# Patient Record
Sex: Male | Born: 1950 | Race: White | Hispanic: No | Marital: Married | Smoking: Former smoker
Health system: Southern US, Community
[De-identification: ages and names within clinical notes are randomized; demographics above are authoritative.]

## PROBLEM LIST (undated history)

## (undated) DIAGNOSIS — I714 Abdominal aortic aneurysm, without rupture, unspecified: Secondary | ICD-10-CM

## (undated) DIAGNOSIS — E7800 Pure hypercholesterolemia, unspecified: Secondary | ICD-10-CM

## (undated) DIAGNOSIS — N189 Chronic kidney disease, unspecified: Secondary | ICD-10-CM

## (undated) HISTORY — DX: Pure hypercholesterolemia, unspecified: E78.00

## (undated) HISTORY — PX: NOSE SURGERY: SHX723

## (undated) HISTORY — DX: Chronic kidney disease, unspecified: N18.9

## (undated) HISTORY — DX: Abdominal aortic aneurysm, without rupture, unspecified: I71.40

---

## 2003-01-04 ENCOUNTER — Ambulatory Visit (HOSPITAL_COMMUNITY): Admission: RE | Admit: 2003-01-04 | Discharge: 2003-01-04 | Payer: Self-pay | Admitting: Gastroenterology

## 2016-05-27 ENCOUNTER — Other Ambulatory Visit: Payer: Self-pay | Admitting: Gastroenterology

## 2017-11-07 DIAGNOSIS — R42 Dizziness and giddiness: Secondary | ICD-10-CM | POA: Diagnosis not present

## 2017-11-07 DIAGNOSIS — R410 Disorientation, unspecified: Secondary | ICD-10-CM | POA: Diagnosis not present

## 2017-11-08 ENCOUNTER — Other Ambulatory Visit: Payer: Self-pay | Admitting: Family Medicine

## 2017-11-08 DIAGNOSIS — R0989 Other specified symptoms and signs involving the circulatory and respiratory systems: Secondary | ICD-10-CM

## 2017-11-09 ENCOUNTER — Encounter: Payer: Self-pay | Admitting: Neurology

## 2017-11-09 ENCOUNTER — Ambulatory Visit
Admission: RE | Admit: 2017-11-09 | Discharge: 2017-11-09 | Disposition: A | Discharge status: Home / Self Care | Payer: Medicare HMO | Source: Ambulatory Visit | Attending: Family Medicine | Admitting: Family Medicine

## 2017-11-09 ENCOUNTER — Encounter (INDEPENDENT_AMBULATORY_CARE_PROVIDER_SITE_OTHER): Payer: Self-pay

## 2017-11-09 ENCOUNTER — Ambulatory Visit: Payer: Medicare HMO | Admitting: Neurology

## 2017-11-09 VITALS — BP 138/82 | HR 78 | Ht 68.5 in | Wt 143.5 lb

## 2017-11-09 DIAGNOSIS — H81399 Other peripheral vertigo, unspecified ear: Secondary | ICD-10-CM | POA: Diagnosis not present

## 2017-11-09 DIAGNOSIS — R002 Palpitations: Secondary | ICD-10-CM | POA: Insufficient documentation

## 2017-11-09 DIAGNOSIS — I6523 Occlusion and stenosis of bilateral carotid arteries: Secondary | ICD-10-CM | POA: Diagnosis not present

## 2017-11-09 DIAGNOSIS — R42 Dizziness and giddiness: Secondary | ICD-10-CM | POA: Diagnosis not present

## 2017-11-09 DIAGNOSIS — R0989 Other specified symptoms and signs involving the circulatory and respiratory systems: Secondary | ICD-10-CM

## 2017-11-09 NOTE — Progress Notes (Signed)
PATIENT: Ian [name] DOB: 1951/11/11  Chief Complaint  Patient presents with  . Possible TIA    He is here with wife, Ian [name]. Reports episode of dizziness, confusion, staggering gait, and fatigue on 11/05/17. He declined to go the ED for evaluation. He returned to [facility] the next day.  He followed up with his PCP who felt this event may have been a TIA and started him on aspirin 81mg  daily.  No further episodes have occurred.  Marland Kitchen PCP    Leighton Ruff, MD     HISTORICAL  Ian [name] accompanied by his wife Ian [name], seen in refer by primary care doctor Ian [name] for evaluation of acute onset dizziness, initial evaluation was on November 09, 2017.  He was previously healthy, has a habit of drinking 4 or 5 cups of coffee on the weekend, November 05, 2017, he drank 5 cups of coffee, felt heart palpitations, mild dizziness, he got up walked about 50 yards to his mailbox, on his way back, he felt increased lightheadedness, generalized weakness, heart palpitation, fell to the ground, no loss of consciousness, but difficulty to get up, he stayed on the ground for at least 10 minutes, his symptoms had mild improvement, but he was able to walk back to his home, but felt during the rest of the morning, he denies vertigo, no loss of consciousness, no lateralized motor or sensory deficit, He was seen by his primary care physician, reported normal CMP CBC TSH, was started on aspirin 81 mg daily, ultrasound of carotid artery was ordered  REVIEW OF SYSTEMS: Full 14 system review of systems performed and notable only for weight loss, fatigue, dizziness, restless leg, anxiety, decreased energy, urination problem  ALLERGIES: No Known Allergies  HOME MEDICATIONS: Current Outpatient Medications  Medication Sig Dispense Refill  . aspirin 81 MG tablet Take 81 mg by mouth daily.    . Multiple Vitamin (MULTIVITAMIN) tablet Take 1 tablet by mouth daily.     No current facility-administered  medications for this visit.     PAST MEDICAL HISTORY: Past Medical History:  Diagnosis Date  . Hypercholesteremia     PAST SURGICAL HISTORY: Past Surgical History:  Procedure Laterality Date  . NOSE SURGERY      FAMILY HISTORY: Family History  Problem Relation Age of Onset  . Colon cancer Mother   . Bladder Cancer Father     SOCIAL HISTORY:  Social History   Socioeconomic History  . Marital status: Married    Spouse name: Not on file  . Number of children: 0  . Years of education: 16  . Highest education level: Bachelor's degree (e.g., BA, AB, BS)  Social Needs  . Financial resource strain: Not on file  . Food insecurity - worry: Not on file  . Food insecurity - inability: Not on file  . Transportation needs - medical: Not on file  . Transportation needs - non-medical: Not on file  Occupational History  . Occupation: Quality control  Tobacco Use  . Smoking status: Former Research scientist (life sciences)  . Smokeless tobacco: Never Used  Substance and Sexual Activity  . Alcohol use: No    Frequency: Never  . Drug use: No  . Sexual activity: Not on file  Other Topics Concern  . Not on file  Social History Narrative   Lives at home with his wife.   Right-handed.   No daily use of caffeine.     PHYSICAL EXAM   Vitals:   11/09/17 0728  BP:  138/82  Pulse: 78  Weight: 143 lb 8 oz (65.1 kg)  Height: 5' 8.5" (1.74 m)    Not recorded      Body mass index is 21.5 kg/m.  PHYSICAL EXAMNIATION:  Gen: NAD, conversant, well nourised, obese, well groomed                     Cardiovascular: Regular rate rhythm, no peripheral edema, warm, nontender. Eyes: Conjunctivae clear without exudates or hemorrhage Neck: Supple, no carotid bruits. Pulmonary: Clear to auscultation bilaterally   NEUROLOGICAL EXAM:  MENTAL STATUS: Speech:    Speech is normal; fluent and spontaneous with normal comprehension.  Cognition:     Orientation to time, place and person     Normal recent and  remote memory     Normal Attention span and concentration     Normal Language, naming, repeating,spontaneous speech     Fund of knowledge   CRANIAL NERVES: CN II: Visual fields are full to confrontation. Fundoscopic exam is normal with sharp discs and no vascular changes. Pupils are round equal and briskly reactive to light. CN III, IV, VI: extraocular movement are normal. No ptosis. CN V: Facial sensation is intact to pinprick in all 3 divisions bilaterally. Corneal responses are intact.  CN VII: Face is symmetric with normal eye closure and smile. CN VIII: Hearing is normal to rubbing fingers CN IX, X: Palate elevates symmetrically. Phonation is normal. CN XI: Head turning and shoulder shrug are intact CN XII: Tongue is midline with normal movements and no atrophy.  MOTOR: There is no pronator drift of out-stretched arms. Muscle bulk and tone are normal. Muscle strength is normal.  REFLEXES: Reflexes are 2+ and symmetric at the biceps, triceps, knees, and ankles. Plantar responses are flexor.  SENSORY: Intact to light touch, pinprick, positional sensation and vibratory sensation are intact in fingers and toes.  COORDINATION: Rapid alternating movements and fine finger movements are intact. There is no dysmetria on finger-to-nose and heel-knee-shin.    GAIT/STANCE: Posture is normal. Gait is steady with normal steps, base, arm swing, and turning. Heel and toe walking are normal. Tandem gait is normal.  Romberg is absent.   DIAGNOSTIC DATA (LABS, IMAGING, TESTING) - I reviewed patient records, labs, notes, testing and imaging myself where available.   ASSESSMENT AND PLAN  DEJEAN TRIBBY is a 66 y.o. male   Acute onset of dizziness, palpitation, excessive caffeine intake  Differentiation diagnosis including cardiac arrhythmia, remote possibility of TIA  Continue aspirin 81 mg daily  Holter monitoring, echocardiogram  MRI of the brain   Marcial Pacas, M.D.  Ph.D.  [facility] [address] [place], [place] [address] Ph: [phone] Fax: [phone]  MP:NTIR, Edwyna Shell, MD

## 2017-11-10 ENCOUNTER — Other Ambulatory Visit: Payer: Self-pay

## 2017-11-10 ENCOUNTER — Telehealth: Payer: Self-pay | Admitting: *Deleted

## 2017-11-10 ENCOUNTER — Ambulatory Visit (HOSPITAL_COMMUNITY): Payer: Medicare HMO | Attending: Cardiovascular Disease

## 2017-11-10 DIAGNOSIS — R42 Dizziness and giddiness: Secondary | ICD-10-CM

## 2017-11-10 DIAGNOSIS — R002 Palpitations: Secondary | ICD-10-CM | POA: Diagnosis not present

## 2017-11-10 DIAGNOSIS — H81399 Other peripheral vertigo, unspecified ear: Secondary | ICD-10-CM

## 2017-11-10 DIAGNOSIS — I420 Dilated cardiomyopathy: Secondary | ICD-10-CM | POA: Diagnosis not present

## 2017-11-10 LAB — ECHOCARDIOGRAM COMPLETE
Ao-asc: 37 cm
CHL CUP MV DEC (S): 246
E/e' ratio: 7.22
EWDT: 246 ms
FS: 31 % (ref 28–44)
IV/PV OW: 1.07
LA ID, A-P, ES: 38 mm
LA diam index: 2.13 cm/m2
LA vol A4C: 31.3 ml
LA vol index: 23.8 mL/m2
LA vol: 42.3 mL
LEFT ATRIUM END SYS DIAM: 38 mm
LV E/e'average: 7.22
LV PW d: 8.99 mm — AB (ref 0.6–1.1)
LV TDI E'MEDIAL: 7.4
LVEEMED: 7.22
LVELAT: 7.94 cm/s
LVOT VTI: 19.7 cm
LVOT area: 3.46 cm2
LVOTD: 21 mm
LVOTPV: 80.7 cm/s
LVOTSV: 68 mL
MV pk E vel: 57.3 m/s
MVPKAVEL: 80.4 m/s
RV LATERAL S' VELOCITY: 9.9 cm/s
RV TAPSE: 17.8 mm
TDI e' lateral: 7.94

## 2017-11-10 NOTE — Telephone Encounter (Signed)
-----   Message from Marcial Pacas, MD sent at 11/10/2017  1:56 PM EST ----- Please call patient showed mild inferior wall hypokinesia, which can be seen in patient with previous cardiac muscle damage such as CAD, normal EF 50-55%, no valvular disease.

## 2017-11-10 NOTE — Telephone Encounter (Signed)
Spoke to patient's wife on HIPAA - she is aware of results.  Results will also be faxed to his PCP, Dr. Jacelyn Grip.

## 2017-11-18 ENCOUNTER — Ambulatory Visit
Admission: RE | Admit: 2017-11-18 | Discharge: 2017-11-18 | Disposition: A | Discharge status: Home / Self Care | Payer: Medicare HMO | Source: Ambulatory Visit | Attending: Neurology | Admitting: Neurology

## 2017-11-18 DIAGNOSIS — H81399 Other peripheral vertigo, unspecified ear: Secondary | ICD-10-CM

## 2017-11-19 DIAGNOSIS — H81399 Other peripheral vertigo, unspecified ear: Secondary | ICD-10-CM | POA: Diagnosis not present

## 2017-11-21 ENCOUNTER — Telehealth: Payer: Self-pay | Admitting: Neurology

## 2017-11-21 NOTE — Telephone Encounter (Addendum)
Spoke to patient's wife on HIPAA - she is aware of MRI results.  Says they have not received a phone call about his holter monitor yet.

## 2017-11-21 NOTE — Telephone Encounter (Signed)
MRI of brain showed atrophy, mild supratentorium small vessel disease.  We will review MRI at next visit in Feb 2019.   IMPRESSION:  This MRI of the brain without contrast shows the following: 1.   Scattered T2/FLAIR hyperintense foci in the hemispheres consistent with mild chronic microvascular ischemic change. 2.   There are no acute findings.

## 2017-11-22 NOTE — Telephone Encounter (Signed)
Called and left [place] a message to call and schedule an apt [phone] . Will call and give patient the number also.

## 2017-12-13 ENCOUNTER — Ambulatory Visit (INDEPENDENT_AMBULATORY_CARE_PROVIDER_SITE_OTHER): Payer: Medicare HMO

## 2017-12-13 ENCOUNTER — Encounter (INDEPENDENT_AMBULATORY_CARE_PROVIDER_SITE_OTHER): Payer: Self-pay

## 2017-12-13 DIAGNOSIS — R002 Palpitations: Secondary | ICD-10-CM

## 2017-12-13 DIAGNOSIS — H81399 Other peripheral vertigo, unspecified ear: Secondary | ICD-10-CM | POA: Diagnosis not present

## 2017-12-13 DIAGNOSIS — R42 Dizziness and giddiness: Secondary | ICD-10-CM

## 2017-12-20 ENCOUNTER — Telehealth: Payer: Self-pay | Admitting: *Deleted

## 2017-12-20 NOTE — Telephone Encounter (Signed)
-----   Message from Marcial Pacas, MD sent at 12/20/2017  9:12 AM EST ----- Please call patient: No significant abnormality found in 48 hours Holter monitoring

## 2017-12-20 NOTE — Telephone Encounter (Signed)
Left patient a detailed message, with results, on voicemail (ok per DPR).  Provided our number to call back with any questions.  

## 2018-01-26 ENCOUNTER — Ambulatory Visit: Payer: Medicare HMO | Admitting: Neurology

## 2018-05-04 ENCOUNTER — Encounter: Payer: Self-pay | Admitting: Neurology

## 2018-05-04 ENCOUNTER — Ambulatory Visit: Payer: Medicare HMO | Admitting: Neurology

## 2018-05-04 VITALS — BP 128/64 | HR 72 | Ht 68.5 in | Wt 146.0 lb

## 2018-05-04 DIAGNOSIS — R002 Palpitations: Secondary | ICD-10-CM | POA: Diagnosis not present

## 2018-05-04 DIAGNOSIS — R42 Dizziness and giddiness: Secondary | ICD-10-CM

## 2018-05-04 DIAGNOSIS — I779 Disorder of arteries and arterioles, unspecified: Secondary | ICD-10-CM | POA: Diagnosis not present

## 2018-05-04 DIAGNOSIS — I739 Peripheral vascular disease, unspecified: Secondary | ICD-10-CM

## 2018-05-04 NOTE — Progress Notes (Signed)
PATIENT: Ian [name] DOB: [birth year]  Chief Complaint  Patient presents with  . Dizziness    He is here with his wife, Stanton Kidney, to review results (MRI, ECHO, Holter monitoring).  Says Dr. Jacelyn Grip stopped his daily aspirin 81mg  and started him on atorvastatin 20mg  daily.     HISTORICAL  Ian [name] accompanied by his wife Stanton Kidney, seen in refer by primary care doctor Yaakov Guthrie for evaluation of acute onset dizziness, initial evaluation was on November 09, 2017.  He was previously healthy, has a habit of drinking 4 or 5 cups of coffee on the weekend, November 05, 2017, he drank 5 cups of coffee, felt heart palpitations, mild dizziness, he got up walked about 50 yards to his mailbox, on his way back, he felt increased lightheadedness, generalized weakness, heart palpitation, fell to the ground, no loss of consciousness, but difficulty to get up, he stayed on the ground for at least 10 minutes, his symptoms had mild improvement, but he was able to walk back to his home, but felt during the rest of the morning, he denies vertigo, no loss of consciousness, no lateralized motor or sensory deficit, He was seen by his primary care physician, reported normal CMP CBC TSH, was started on aspirin 81 mg daily, ultrasound of carotid artery was ordered  UPDATE May 04 2018: He is doing very well, has no recurrent spells of dizziness,  We reviewed MRI of the brain in December 2018, mild generalized atrophy, supratentorium small vessel disease Ultrasound of carotid arteries showed right internal carotid artery moderate mixed plaque in the right bulb, 50 to 69% stenosis, left side is less than 50% stenosis, mild calcified plaque, bilateral vertebral artery was antegrade  48 hours Holter monitoring was normal  His aspirin 81 mg was stopped by his primary care physician after his wife reported that he has mild GI symptoms,  REVIEW OF SYSTEMS: Full 14 system review of systems performed and notable  only for unexpected weight change, frequent urination, bloody urine, bruise easily  ALLERGIES: No Known Allergies  HOME MEDICATIONS: Current Outpatient Medications  Medication Sig Dispense Refill  . atorvastatin (LIPITOR) 20 MG tablet Take 20 mg by mouth daily.  5  . UNABLE TO FIND Take 2 tablets by mouth daily. Super Beta Prostate     No current facility-administered medications for this visit.     PAST MEDICAL HISTORY: Past Medical History:  Diagnosis Date  . Hypercholesteremia     PAST SURGICAL HISTORY: Past Surgical History:  Procedure Laterality Date  . NOSE SURGERY      FAMILY HISTORY: Family History  Problem Relation Age of Onset  . Colon cancer Mother   . Bladder Cancer Father     SOCIAL HISTORY:  Social History   Socioeconomic History  . Marital status: Married    Spouse name: Not on file  . Number of children: 0  . Years of education: 16  . Highest education level: Bachelor's degree (e.g., BA, AB, BS)  Occupational History  . Occupation: Quality control  Social Needs  . Financial resource strain: Not on file  . Food insecurity:    Worry: Not on file    Inability: Not on file  . Transportation needs:    Medical: Not on file    Non-medical: Not on file  Tobacco Use  . Smoking status: Former Research scientist (life sciences)  . Smokeless tobacco: Never Used  Substance and Sexual Activity  . Alcohol use: No    Frequency: Never  .  Drug use: No  . Sexual activity: Not on file  Lifestyle  . Physical activity:    Days per week: Not on file    Minutes per session: Not on file  . Stress: Not on file  Relationships  . Social connections:    Talks on phone: Not on file    Gets together: Not on file    Attends religious service: Not on file    Active member of club or organization: Not on file    Attends meetings of clubs or organizations: Not on file    Relationship status: Not on file  . Intimate partner violence:    Fear of current or ex partner: Not on file     Emotionally abused: Not on file    Physically abused: Not on file    Forced sexual activity: Not on file  Other Topics Concern  . Not on file  Social History Narrative   Lives at home with his wife.   Right-handed.   No daily use of caffeine.     PHYSICAL EXAM   Vitals:   05/04/18 1601  BP: 128/64  Pulse: 72  Weight: 146 lb (66.2 kg)  Height: 5' 8.5" (1.74 m)    Not recorded      Body mass index is 21.88 kg/m.  PHYSICAL EXAMNIATION:  Gen: NAD, conversant, well nourised, obese, well groomed                     Cardiovascular: Regular rate rhythm, no peripheral edema, warm, nontender. Eyes: Conjunctivae clear without exudates or hemorrhage Neck: Supple, no carotid bruits. Pulmonary: Clear to auscultation bilaterally   NEUROLOGICAL EXAM:  MENTAL STATUS: Speech:    Speech is normal; fluent and spontaneous with normal comprehension.  Cognition:     Orientation to time, place and person     Normal recent and remote memory     Normal Attention span and concentration     Normal Language, naming, repeating,spontaneous speech     Fund of knowledge   CRANIAL NERVES: CN II: Visual fields are full to confrontation. Fundoscopic exam is normal with sharp discs and no vascular changes. Pupils are round equal and briskly reactive to light. CN III, IV, VI: extraocular movement are normal. No ptosis. CN V: Facial sensation is intact to pinprick in all 3 divisions bilaterally. Corneal responses are intact.  CN VII: Face is symmetric with normal eye closure and smile. CN VIII: Hearing is normal to rubbing fingers CN IX, X: Palate elevates symmetrically. Phonation is normal. CN XI: Head turning and shoulder shrug are intact CN XII: Tongue is midline with normal movements and no atrophy.  MOTOR: There is no pronator drift of out-stretched arms. Muscle bulk and tone are normal. Muscle strength is normal.  REFLEXES: Reflexes are 2+ and symmetric at the biceps, triceps, knees, and  ankles. Plantar responses are flexor.  SENSORY: Intact to light touch, pinprick, positional sensation and vibratory sensation are intact in fingers and toes.  COORDINATION: Rapid alternating movements and fine finger movements are intact. There is no dysmetria on finger-to-nose and heel-knee-shin.    GAIT/STANCE: Posture is normal. Gait is steady with normal steps, base, arm swing, and turning. Heel and toe walking are normal. Tandem gait is normal.  Romberg is absent.   DIAGNOSTIC DATA (LABS, IMAGING, TESTING) - I reviewed patient records, labs, notes, testing and imaging myself where available.   ASSESSMENT AND PLAN  Ian [name] is a 67 y.o. male  Acute onset of dizziness, palpitation, excessive caffeine intake  Differentiation diagnosis including cardiac arrhythmia due to excessive caffeine intake  He has no recurrent spells,,  Bilateral internal carotid artery disease  He would benefit aspirin 81 mg daily  Continue to address vascular risk factor  Marcial Pacas, M.D. Ph.D.  [facility] [address], [place], [place] [address] Ph: [phone] Fax: [phone]  N[place]:UDOD, Edwyna Shell, MD

## 2018-06-01 DIAGNOSIS — K2900 Acute gastritis without bleeding: Secondary | ICD-10-CM | POA: Diagnosis not present

## 2018-06-01 DIAGNOSIS — R634 Abnormal weight loss: Secondary | ICD-10-CM | POA: Diagnosis not present

## 2018-06-01 DIAGNOSIS — R7301 Impaired fasting glucose: Secondary | ICD-10-CM | POA: Diagnosis not present

## 2018-06-01 DIAGNOSIS — Z0000 Encounter for general adult medical examination without abnormal findings: Secondary | ICD-10-CM | POA: Diagnosis not present

## 2018-06-01 DIAGNOSIS — K529 Noninfective gastroenteritis and colitis, unspecified: Secondary | ICD-10-CM | POA: Diagnosis not present

## 2018-06-01 DIAGNOSIS — E7800 Pure hypercholesterolemia, unspecified: Secondary | ICD-10-CM | POA: Diagnosis not present

## 2018-06-01 DIAGNOSIS — Z125 Encounter for screening for malignant neoplasm of prostate: Secondary | ICD-10-CM | POA: Diagnosis not present

## 2018-06-01 DIAGNOSIS — R410 Disorientation, unspecified: Secondary | ICD-10-CM | POA: Diagnosis not present

## 2018-06-01 DIAGNOSIS — I779 Disorder of arteries and arterioles, unspecified: Secondary | ICD-10-CM | POA: Diagnosis not present

## 2018-06-01 DIAGNOSIS — R931 Abnormal findings on diagnostic imaging of heart and coronary circulation: Secondary | ICD-10-CM | POA: Diagnosis not present

## 2018-06-02 DIAGNOSIS — K529 Noninfective gastroenteritis and colitis, unspecified: Secondary | ICD-10-CM | POA: Diagnosis not present

## 2018-06-15 DIAGNOSIS — R634 Abnormal weight loss: Secondary | ICD-10-CM | POA: Diagnosis not present

## 2018-06-15 DIAGNOSIS — R7989 Other specified abnormal findings of blood chemistry: Secondary | ICD-10-CM | POA: Diagnosis not present

## 2018-06-15 DIAGNOSIS — R748 Abnormal levels of other serum enzymes: Secondary | ICD-10-CM | POA: Diagnosis not present

## 2018-07-06 DIAGNOSIS — Z0189 Encounter for other specified special examinations: Secondary | ICD-10-CM | POA: Diagnosis not present

## 2018-07-06 DIAGNOSIS — I6521 Occlusion and stenosis of right carotid artery: Secondary | ICD-10-CM | POA: Diagnosis not present

## 2018-08-02 DIAGNOSIS — I6521 Occlusion and stenosis of right carotid artery: Secondary | ICD-10-CM | POA: Diagnosis not present

## 2018-09-25 DIAGNOSIS — R7301 Impaired fasting glucose: Secondary | ICD-10-CM | POA: Diagnosis not present

## 2018-09-25 DIAGNOSIS — R899 Unspecified abnormal finding in specimens from other organs, systems and tissues: Secondary | ICD-10-CM | POA: Diagnosis not present

## 2018-09-25 DIAGNOSIS — E7800 Pure hypercholesterolemia, unspecified: Secondary | ICD-10-CM | POA: Diagnosis not present

## 2018-11-13 DIAGNOSIS — R7301 Impaired fasting glucose: Secondary | ICD-10-CM | POA: Diagnosis not present

## 2018-11-13 DIAGNOSIS — R899 Unspecified abnormal finding in specimens from other organs, systems and tissues: Secondary | ICD-10-CM | POA: Diagnosis not present

## 2018-11-13 DIAGNOSIS — E7800 Pure hypercholesterolemia, unspecified: Secondary | ICD-10-CM | POA: Diagnosis not present

## 2018-11-20 DIAGNOSIS — R899 Unspecified abnormal finding in specimens from other organs, systems and tissues: Secondary | ICD-10-CM | POA: Diagnosis not present

## 2018-11-20 DIAGNOSIS — R7301 Impaired fasting glucose: Secondary | ICD-10-CM | POA: Diagnosis not present

## 2018-11-20 DIAGNOSIS — E7800 Pure hypercholesterolemia, unspecified: Secondary | ICD-10-CM | POA: Diagnosis not present

## 2018-12-01 ENCOUNTER — Encounter (HOSPITAL_BASED_OUTPATIENT_CLINIC_OR_DEPARTMENT_OTHER): Payer: Self-pay | Admitting: *Deleted

## 2018-12-01 ENCOUNTER — Other Ambulatory Visit: Payer: Self-pay

## 2018-12-01 ENCOUNTER — Emergency Department (HOSPITAL_BASED_OUTPATIENT_CLINIC_OR_DEPARTMENT_OTHER)
Admission: EM | Admit: 2018-12-01 | Discharge: 2018-12-01 | Disposition: A | Discharge status: Home / Self Care | Payer: Medicare HMO | Attending: Emergency Medicine | Admitting: Emergency Medicine

## 2018-12-01 DIAGNOSIS — Y92810 Car as the place of occurrence of the external cause: Secondary | ICD-10-CM | POA: Diagnosis not present

## 2018-12-01 DIAGNOSIS — Y999 Unspecified external cause status: Secondary | ICD-10-CM | POA: Diagnosis not present

## 2018-12-01 DIAGNOSIS — Z79899 Other long term (current) drug therapy: Secondary | ICD-10-CM | POA: Diagnosis not present

## 2018-12-01 DIAGNOSIS — W228XXA Striking against or struck by other objects, initial encounter: Secondary | ICD-10-CM | POA: Diagnosis not present

## 2018-12-01 DIAGNOSIS — Z23 Encounter for immunization: Secondary | ICD-10-CM | POA: Insufficient documentation

## 2018-12-01 DIAGNOSIS — S0181XA Laceration without foreign body of other part of head, initial encounter: Secondary | ICD-10-CM | POA: Diagnosis present

## 2018-12-01 DIAGNOSIS — I2510 Atherosclerotic heart disease of native coronary artery without angina pectoris: Secondary | ICD-10-CM | POA: Insufficient documentation

## 2018-12-01 DIAGNOSIS — Y9389 Activity, other specified: Secondary | ICD-10-CM | POA: Diagnosis not present

## 2018-12-01 DIAGNOSIS — Z87891 Personal history of nicotine dependence: Secondary | ICD-10-CM | POA: Insufficient documentation

## 2018-12-01 DIAGNOSIS — S01111A Laceration without foreign body of right eyelid and periocular area, initial encounter: Secondary | ICD-10-CM | POA: Diagnosis not present

## 2018-12-01 MED ORDER — LIDOCAINE-EPINEPHRINE 2 %-1:100000 IJ SOLN
10.0000 mL | Freq: Once | INTRAMUSCULAR | Status: DC
  Filled 2018-12-01: qty 10.2

## 2018-12-01 MED ORDER — LIDOCAINE-EPINEPHRINE 1 %-1:100000 IJ SOLN
INTRAMUSCULAR | Status: AC
  Administered 2018-12-01: 1 mL
  Filled 2018-12-01: qty 1

## 2018-12-01 MED ORDER — TETANUS-DIPHTH-ACELL PERTUSSIS 5-2.5-18.5 LF-MCG/0.5 IM SUSP: 0.5000 mL | Freq: Once | INTRAMUSCULAR | Status: DC

## 2018-12-01 NOTE — ED Triage Notes (Signed)
He hit his forehead on his car door. Laceration above his right eye. Dressing intact on arrival to the ED. No LOC.

## 2018-12-01 NOTE — Discharge Instructions (Signed)
1. Medications: Alternate 600 mg of ibuprofen and [phone] mg of Tylenol every 3 hours as needed for pain. Do not exceed 4000 mg of Tylenol daily.  Take ibuprofen with food to avoid upset stomach issues. 2. Treatment: ice for swelling, keep wound clean with warm soap and water and keep bandage dry, do not submerge in water for 24 hours 3. Follow Up: Please return in 5 days to have your stitches/staples removed or sooner if you have concerns.  You may also go to urgent care or your primary care physician.  Return to the emergency department sooner if any concerning signs or symptoms develop such as high fevers, redness, drainage of pus from the wound, or swelling.  Also return if there is any evidence of serious head injury including severe headache, vision changes, vomiting, or altered mental status.   WOUND CARE  Keep area clean and dry for 24 hours. Do not remove bandage, if applied.  After 24 hours, remove bandage and wash wound gently with mild soap and warm water. Reapply a new bandage after cleaning wound, if directed.   Continue daily cleansing with soap and water until stitches/staples are removed.  Do not apply any ointments or creams to the wound while stitches/staples are in place, as this may cause delayed healing. Return if you experience any of the following signs of infection: Swelling, redness, pus drainage, streaking, fever >101.0 F  Return if you experience excessive bleeding that does not stop after 15-20 minutes of constant, firm pressure.

## 2018-12-01 NOTE — ED Provider Notes (Signed)
[facility] EMERGENCY DEPARTMENT Provider Note   CSN: [id number] Arrival date & time: 12/01/18  1617     History   Chief Complaint Chief Complaint  Patient presents with  . Laceration    HPI Ian [name] is a 67 y.o. male with history of hypercholesterolemia presents for evaluation of acute onset, persistent wound to the right eyebrow sustained just prior to arrival.  He reports that he was attempting to close his car door when the "sharp tip "of the door grazed against his right eyebrow.  He denies loss of consciousness, headache, vision changes, nausea, vomiting, numbness, tingling, or weakness.  No prodrome leading up to the injury.  He is not currently anticoagulated.  His tetanus was updated just prior to my assessment.  The history is provided by the patient.    Past Medical History:  Diagnosis Date  . Hypercholesteremia     Patient Active Problem List   Diagnosis Date Noted  . Bilateral carotid artery disease ([place]) 05/04/2018  . Dizzinesses 11/09/2017  . Palpitation 11/09/2017    Past Surgical History:  Procedure Laterality Date  . NOSE SURGERY          Home Medications    Prior to Admission medications   Medication Sig Start Date End Date Taking? Authorizing Provider  atorvastatin (LIPITOR) 20 MG tablet Take 20 mg by mouth daily. 04/20/18   [provider]  UNABLE TO FIND Take 2 tablets by mouth daily. Super Beta Prostate    [provider]    Family History Family History  Problem Relation Age of Onset  . Colon cancer Mother   . Bladder Cancer Father     Social History Social History   Tobacco Use  . Smoking status: Former Research scientist (life sciences)  . Smokeless tobacco: Never Used  Substance Use Topics  . Alcohol use: No    Frequency: Never  . Drug use: No     Allergies   Patient has no known allergies.   Review of Systems Review of Systems  Constitutional: Negative for chills and fever.  Eyes: Negative for  photophobia and visual disturbance.  Respiratory: Negative for shortness of breath.   Cardiovascular: Negative for chest pain.  Gastrointestinal: Negative for nausea and vomiting.  Skin: Positive for wound.  Neurological: Negative for syncope, light-headedness and headaches.     Physical Exam Updated Vital Signs BP 140/90 (BP Location: Left Arm)   Pulse (!) 104   Temp 98.4 F (36.9 C) (Oral)   Resp 18   Ht 5\' 8"  (1.727 m)   Wt 65.8 kg   SpO2 98%   BMI 22.05 kg/m   Physical Exam Vitals signs and nursing note reviewed.  Constitutional:      General: He is not in acute distress.    Appearance: He is well-developed.  HENT:     Head: Normocephalic.     Comments: No Battle's signs, no raccoon's eyes, no rhinorrhea. No hemotympanum.  See below images.  Patient has a 4 cm laceration overlying the right eyebrow.  He is able to raise his eyebrows bilaterally equally. Minimal tenderness to palpation of the wound.  No underlying crepitus or deformity. Eyes:     General:        Right eye: No discharge.        Left eye: No discharge.     Extraocular Movements: Extraocular movements intact.     Conjunctiva/sclera: Conjunctivae normal.     Pupils: Pupils are equal, round, and reactive to light.  Comments: No chemosis, proptosis, or consensual photophobia.  No tenderness to palpation of the orbital rim.  No pain with EOMs  Neck:     Vascular: No JVD.     Trachea: No tracheal deviation.  Cardiovascular:     Rate and Rhythm: Normal rate.  Pulmonary:     Effort: Pulmonary effort is normal.  Abdominal:     General: There is no distension.  Skin:    Findings: No erythema.  Neurological:     General: No focal deficit present.     Mental Status: He is alert and oriented to person, place, and time.     Cranial Nerves: No cranial nerve deficit.     Sensory: No sensory deficit.     Motor: No weakness.     Coordination: Coordination normal.     Comments: 5/5 strength of BUE and BLE  major muscle groups.  Cranial nerves II through XII tested and intact.  Fluent speech with no evidence of dysarthria or aphasia.  Psychiatric:        Behavior: Behavior normal.      ED Treatments / Results  Labs (all labs ordered are listed, but only abnormal results are displayed) Labs Reviewed - No data to display  EKG None  Radiology No results found.  Procedures .Marland KitchenLaceration Repair Date/Time: 12/01/2018 5:52 PM Performed by: Renita Papa, PA-C Authorized by: Renita Papa, PA-C   Consent:    Consent obtained:  Verbal   Consent given by:  Patient   Risks discussed:  Infection, need for additional repair, pain, poor cosmetic result and poor wound healing   Alternatives discussed:  No treatment and delayed treatment Universal protocol:    Procedure explained and questions answered to patient or proxy's satisfaction: yes     Relevant documents present and verified: yes     Test results available and properly labeled: yes     Required blood products, implants, devices, and special equipment available: yes     Site/side marked: yes     Immediately prior to procedure, a time out was called: yes     Patient identity confirmed:  Verbally with patient Anesthesia (see MAR for exact dosages):    Anesthesia method:  Local infiltration   Local anesthetic:  Lidocaine 1% WITH epi Laceration details:    Location:  Face   Face location:  R eyebrow   Length (cm):  4   Depth (mm):  4 Repair type:    Repair type:  Simple Pre-procedure details:    Preparation:  Patient was prepped and draped in usual sterile fashion Exploration:    Hemostasis achieved with:  Direct pressure   Wound exploration: wound explored through full range of motion     Wound extent: areolar tissue violated     Wound extent: no muscle damage noted     Contaminated: yes   Treatment:    Area cleansed with:  Betadine and saline   Amount of cleaning:  Extensive   Irrigation solution:  Sterile saline    Irrigation method:  Pressure wash   Visualized foreign bodies/material removed: no   Skin repair:    Repair method:  Sutures   Suture size:  6-0   Suture material:  Prolene   Suture technique:  Simple interrupted   Number of sutures:  8 Approximation:    Approximation:  Close Post-procedure details:    Dressing:  Open (no dressing)   Patient tolerance of procedure:  Tolerated well, no immediate complications   (  including critical care time)    Medications Ordered in ED Medications  lidocaine-EPINEPHrine (XYLOCAINE W/EPI) 1 %-1:100000 (with pres) injection (has no administration in time range)  Tdap (BOOSTRIX) injection 0.5 mL (has no administration in time range)     Initial Impression / Assessment and Plan / ED Course  I have reviewed the triage vital signs and the nursing notes.  Pertinent labs & imaging results that were available during my care of the patient were reviewed by me and considered in my medical decision making (see chart for details).     Patient with right eyebrow laceration after contact with the corner of a car door frame.  Patient afebrile, initially mildly tachycardic at triage but was not tachycardic on my assessment.  No focal neurologic deficits.  No evidence of serious head injury.  Doubt skull fracture, SAH, ICH, ocular nerve entrapment or orbit/globe rupture. Pressure irrigation performed. Wound explored and base of wound visualized in a bloodless field without evidence of foreign body.  Laceration occurred < 8 hours prior to repair which was well tolerated.  Tdap updated.  Pt has  no comorbidities to effect normal wound healing. Pt discharged  without antibiotics.  Discussed suture home care with patient and answered questions. Pt to follow-up for wound check and suture removal in 5 days; they are to return to the ED sooner for signs of infection.  Discussed signs of serious head injury and indications to return to the ED.  Patient and wife verbalized  understanding of and agreement with plan and patient stable for discharge home at this time.  Final Clinical Impressions(s) / ED Diagnoses   Final diagnoses:  Facial laceration, initial encounter    ED Discharge Orders    None       Debroah Baller 12/01/18 1755    Virgel Manifold, MD 12/01/18 1815

## 2018-12-08 DIAGNOSIS — Z4802 Encounter for removal of sutures: Secondary | ICD-10-CM | POA: Diagnosis not present

## 2018-12-11 DIAGNOSIS — K219 Gastro-esophageal reflux disease without esophagitis: Secondary | ICD-10-CM | POA: Insufficient documentation

## 2018-12-11 DIAGNOSIS — E785 Hyperlipidemia, unspecified: Secondary | ICD-10-CM

## 2018-12-11 DIAGNOSIS — E782 Mixed hyperlipidemia: Secondary | ICD-10-CM | POA: Insufficient documentation

## 2019-01-02 DIAGNOSIS — I6523 Occlusion and stenosis of bilateral carotid arteries: Secondary | ICD-10-CM | POA: Diagnosis not present

## 2019-01-08 ENCOUNTER — Ambulatory Visit: Payer: Self-pay | Admitting: Cardiology

## 2019-01-08 NOTE — Progress Notes (Deleted)
Patient is here for follow up visit.  Subjective:   @Patient  ID: Ian [name], male    DOB: 03-Dec-1951, 68 y.o.   MRN: [id number]  No chief complaint on file.  68 year old Caucasian male with asymptomatic carotid artery stenosis.  Patient has had significant improvement in his carotid artery plaque burden, as seen on carotid US, details below.   Carotid artery duplex 01/02/2019: Minimal tenosis in the right internal carotid artery (1-15%).  Stenosis in the right common carotid artery (<50%). Stenosis in the right external carotid artery (<50%). Minimal stenosis in the left internal carotid artery (1-15%). Stenosis in the left common carotid artery (<50%). Stenosis in the left external carotid artery (<50%). Antegrade right vertebral artery flow. Antegrade left vertebral artery flow. Compared to the study done on 08/02/2018, right ICA velocity is decreased and stenosis range of greater than 50% is now less than 50% throughout.  Significant improvement in plaque burden. Follow up in one year is appropriate if clinically indicated. HPI  Past Medical History:  Diagnosis Date  . Hypercholesteremia     Past Surgical History:  Procedure Laterality Date  . NOSE SURGERY      Social History   Socioeconomic History  . Marital status: Married    Spouse name: Not on file  . Number of children: 0  . Years of education: 16  . Highest education level: Bachelor's degree (e.g., BA, AB, BS)  Occupational History  . Occupation: Quality control  Social Needs  . Financial resource strain: Not on file  . Food insecurity:    Worry: Not on file    Inability: Not on file  . Transportation needs:    Medical: Not on file    Non-medical: Not on file  Tobacco Use  . Smoking status: Former Research scientist (life sciences)  . Smokeless tobacco: Never Used  Substance and Sexual Activity  . Alcohol use: No    Frequency: Never  . Drug use: No  . Sexual activity: Not on file  Lifestyle  . Physical activity:      Days per week: Not on file    Minutes per session: Not on file  . Stress: Not on file  Relationships  . Social connections:    Talks on phone: Not on file    Gets together: Not on file    Attends religious service: Not on file    Active member of club or organization: Not on file    Attends meetings of clubs or organizations: Not on file    Relationship status: Not on file  . Intimate partner violence:    Fear of current or ex partner: Not on file    Emotionally abused: Not on file    Physically abused: Not on file    Forced sexual activity: Not on file  Other Topics Concern  . Not on file  Social History Narrative   Lives at home with his wife.   Right-handed.   No daily use of caffeine.    Current Outpatient Medications on File Prior to Visit  Medication Sig Dispense Refill  . atorvastatin (LIPITOR) 20 MG tablet Take 20 mg by mouth daily.  5  . co-enzyme Q-10 30 MG capsule Take 100 mg by mouth daily.    . Multiple Vitamins-Minerals (MULTIVITAMIN WITH MINERALS) tablet Take 1 tablet by mouth daily.    . rosuvastatin (CRESTOR) 10 MG tablet Take 10 mg by mouth daily.    Marland Kitchen UNABLE TO FIND Take 2 tablets by mouth  daily. Super Beta Prostate     No current facility-administered medications on file prior to visit.     ***Echocardiogram ***  ***Stress test***  ***Coronary angiogram***  Review of Systems     Objective:   There were no vitals filed for this visit.   Physical Exam      Assessment & Recommendations:   No diagnosis found.   Nigel Mormon, MD [facility] Pager: [phone] Office: [phone] If no answer Cell [phone]

## 2019-01-11 ENCOUNTER — Other Ambulatory Visit: Payer: Self-pay | Admitting: Cardiology

## 2019-01-11 DIAGNOSIS — I6521 Occlusion and stenosis of right carotid artery: Secondary | ICD-10-CM

## 2019-01-30 DIAGNOSIS — R7301 Impaired fasting glucose: Secondary | ICD-10-CM | POA: Diagnosis not present

## 2019-01-30 DIAGNOSIS — R944 Abnormal results of kidney function studies: Secondary | ICD-10-CM | POA: Diagnosis not present

## 2019-01-30 DIAGNOSIS — E7800 Pure hypercholesterolemia, unspecified: Secondary | ICD-10-CM | POA: Diagnosis not present

## 2019-06-19 DIAGNOSIS — R634 Abnormal weight loss: Secondary | ICD-10-CM | POA: Diagnosis not present

## 2019-06-19 DIAGNOSIS — Z125 Encounter for screening for malignant neoplasm of prostate: Secondary | ICD-10-CM | POA: Diagnosis not present

## 2019-06-19 DIAGNOSIS — E7800 Pure hypercholesterolemia, unspecified: Secondary | ICD-10-CM | POA: Diagnosis not present

## 2019-06-19 DIAGNOSIS — R1013 Epigastric pain: Secondary | ICD-10-CM | POA: Diagnosis not present

## 2019-06-19 DIAGNOSIS — R7301 Impaired fasting glucose: Secondary | ICD-10-CM | POA: Diagnosis not present

## 2019-06-19 DIAGNOSIS — Z0000 Encounter for general adult medical examination without abnormal findings: Secondary | ICD-10-CM | POA: Diagnosis not present

## 2019-06-19 DIAGNOSIS — I779 Disorder of arteries and arterioles, unspecified: Secondary | ICD-10-CM | POA: Diagnosis not present

## 2019-06-19 DIAGNOSIS — R899 Unspecified abnormal finding in specimens from other organs, systems and tissues: Secondary | ICD-10-CM | POA: Diagnosis not present

## 2019-07-05 ENCOUNTER — Encounter (HOSPITAL_BASED_OUTPATIENT_CLINIC_OR_DEPARTMENT_OTHER): Payer: Self-pay | Admitting: *Deleted

## 2019-07-05 ENCOUNTER — Encounter: Payer: Self-pay | Admitting: Gastroenterology

## 2019-07-05 ENCOUNTER — Emergency Department (HOSPITAL_BASED_OUTPATIENT_CLINIC_OR_DEPARTMENT_OTHER)
Admission: EM | Admit: 2019-07-05 | Discharge: 2019-07-05 | Disposition: A | Discharge status: Home / Self Care | Payer: Medicare HMO | Attending: Emergency Medicine | Admitting: Emergency Medicine

## 2019-07-05 ENCOUNTER — Other Ambulatory Visit: Payer: Self-pay

## 2019-07-05 DIAGNOSIS — Z87891 Personal history of nicotine dependence: Secondary | ICD-10-CM | POA: Diagnosis not present

## 2019-07-05 DIAGNOSIS — Z79899 Other long term (current) drug therapy: Secondary | ICD-10-CM | POA: Insufficient documentation

## 2019-07-05 DIAGNOSIS — R55 Syncope and collapse: Secondary | ICD-10-CM | POA: Diagnosis not present

## 2019-07-05 LAB — CBC WITH DIFFERENTIAL/PLATELET
Abs Immature Granulocytes: 0.01 10*3/uL (ref 0.00–0.07)
Basophils Absolute: 0 10*3/uL (ref 0.0–0.1)
Basophils Relative: 0 %
Eosinophils Absolute: 0.1 10*3/uL (ref 0.0–0.5)
Eosinophils Relative: 1 %
HCT: 39.5 % (ref 39.0–52.0)
Hemoglobin: 12.7 g/dL — ABNORMAL LOW (ref 13.0–17.0)
Immature Granulocytes: 0 %
Lymphocytes Relative: 24 %
Lymphs Abs: 1.8 10*3/uL (ref 0.7–4.0)
MCH: 30.2 pg (ref 26.0–34.0)
MCHC: 32.2 g/dL (ref 30.0–36.0)
MCV: 94 fL (ref 80.0–100.0)
Monocytes Absolute: 0.8 10*3/uL (ref 0.1–1.0)
Monocytes Relative: 11 %
Neutro Abs: 4.6 10*3/uL (ref 1.7–7.7)
Neutrophils Relative %: 64 %
Platelets: 262 10*3/uL (ref 150–400)
RBC: 4.2 MIL/uL — ABNORMAL LOW (ref 4.22–5.81)
RDW: 14.5 % (ref 11.5–15.5)
WBC: 7.4 10*3/uL (ref 4.0–10.5)
nRBC: 0 % (ref 0.0–0.2)

## 2019-07-05 LAB — COMPREHENSIVE METABOLIC PANEL
ALT: 22 U/L (ref 0–44)
AST: 24 U/L (ref 15–41)
Albumin: 3.7 g/dL (ref 3.5–5.0)
Alkaline Phosphatase: 48 U/L (ref 38–126)
Anion gap: 13 (ref 5–15)
BUN: 29 mg/dL — ABNORMAL HIGH (ref 8–23)
CO2: 23 mmol/L (ref 22–32)
Calcium: 9 mg/dL (ref 8.9–10.3)
Chloride: 102 mmol/L (ref 98–111)
Creatinine, Ser: 0.93 mg/dL (ref 0.61–1.24)
GFR calc Af Amer: >60 mL/min (ref >60)
GFR calc non Af Amer: >60 mL/min (ref >60)
Glucose, Bld: 100 mg/dL — ABNORMAL HIGH (ref 70–99)
Potassium: 3.6 mmol/L (ref 3.5–5.1)
Sodium: 138 mmol/L (ref 135–145)
Total Bilirubin: 0.5 mg/dL (ref 0.3–1.2)
Total Protein: 6.9 g/dL (ref 6.5–8.1)

## 2019-07-05 LAB — TROPONIN I (HIGH SENSITIVITY): Troponin I (High Sensitivity): 9 ng/L (ref <18)

## 2019-07-05 MED ORDER — SODIUM CHLORIDE 0.9 % IV BOLUS
1000.0000 mL | Freq: Once | INTRAVENOUS | Status: AC
  Administered 2019-07-05: 1000 mL via INTRAVENOUS

## 2019-07-05 NOTE — ED Provider Notes (Signed)
[facility] EMERGENCY DEPARTMENT Provider Note   CSN: [id number] Arrival date & time: 07/05/19  1318    History   Chief Complaint Chief Complaint  Patient presents with  . Dizziness    HPI Ian [name] is a 68 y.o. male.     HPI  68 year old male presents with near syncope.  The patient was going to work this morning and around 7:30 AM was in his car and feeling lightheaded.  At one point he pulled over and tried to stand up.  He found that his lightheadedness got worse and he fell to the ground but does not think he passed out.  Has some abrasions.  Got back into his car and when he got home he vomited and states that ever since then his lightheadedness and other ill feeling has gone away.  Never had a headache and not injured his head.  No chest pain, shortness of breath. Did not eat breakfast this morning.  Past Medical History:  Diagnosis Date  . Hypercholesteremia     Patient Active Problem List   Diagnosis Date Noted  . HLD (hyperlipidemia) 12/11/2018  . GERD (gastroesophageal reflux disease) 12/11/2018  . Bilateral carotid artery disease ([place]) 05/04/2018  . Dizzinesses 11/09/2017  . Palpitation 11/09/2017    Past Surgical History:  Procedure Laterality Date  . NOSE SURGERY          Home Medications    Prior to Admission medications   Medication Sig Start Date End Date Taking? Authorizing Provider  co-enzyme Q-10 30 MG capsule Take 100 mg by mouth daily.    [provider]  Multiple Vitamins-Minerals (MULTIVITAMIN WITH MINERALS) tablet Take 1 tablet by mouth daily.    [provider]  UNABLE TO FIND Take 2 tablets by mouth daily. Super Beta Prostate    [provider]    Family History Family History  Problem Relation Age of Onset  . Colon cancer Mother   . Bladder Cancer Father     Social History Social History   Tobacco Use  . Smoking status: Former Research scientist (life sciences)  . Smokeless tobacco: Never Used  Substance  Use Topics  . Alcohol use: No    Frequency: Never  . Drug use: No     Allergies   Patient has no known allergies.   Review of Systems Review of Systems  Constitutional: Negative for fever.  Respiratory: Negative for shortness of breath.   Cardiovascular: Negative for chest pain.  Gastrointestinal: Negative for abdominal pain and vomiting.  Neurological: Positive for light-headedness. Negative for weakness, numbness and headaches.  All other systems reviewed and are negative.    Physical Exam Updated Vital Signs BP (!) 175/118   Pulse 95   Temp 98.6 F (37 C) (Oral)   Resp 16   Ht 5\' 9"  (1.753 m)   Wt 63.5 kg   SpO2 99%   BMI 20.67 kg/m   Physical Exam Vitals signs and nursing note reviewed.  Constitutional:      General: He is not in acute distress.    Appearance: He is well-developed. He is not ill-appearing or diaphoretic.  HENT:     Head: Normocephalic and atraumatic.     Right Ear: External ear normal.     Left Ear: External ear normal.     Nose: Nose normal.  Eyes:     General:        Right eye: No discharge.        Left eye: No discharge.  Extraocular Movements: Extraocular movements intact.     Pupils: Pupils are equal, round, and reactive to light.  Neck:     Musculoskeletal: Neck supple.  Cardiovascular:     Rate and Rhythm: Regular rhythm. Tachycardia present.     Heart sounds: Murmur present.     Comments: HR 100 Pulmonary:     Effort: Pulmonary effort is normal.     Breath sounds: Normal breath sounds.  Abdominal:     Palpations: Abdomen is soft.     Tenderness: There is no abdominal tenderness.  Musculoskeletal:     Comments: Abrasion to right shoulder. Normal ROM, no tenderness  Skin:    General: Skin is warm and dry.  Neurological:     Mental Status: He is alert.     Comments: CN 3-12 grossly intact. 5/5 strength in all 4 extremities. Grossly normal sensation. Normal finger to nose.   Psychiatric:        Mood and Affect: Mood is  not anxious.      ED Treatments / Results  Labs (all labs ordered are listed, but only abnormal results are displayed) Labs Reviewed  CBC WITH DIFFERENTIAL/PLATELET - Abnormal; Notable for the following components:      Result Value   RBC 4.20 (*)    Hemoglobin 12.7 (*)    All other components within normal limits  COMPREHENSIVE METABOLIC PANEL - Abnormal; Notable for the following components:   Glucose, Bld 100 (*)    BUN 29 (*)    All other components within normal limits  TROPONIN I (HIGH SENSITIVITY)    EKG EKG Interpretation  Date/Time:  Thursday July 05 2019 13:36:14 EDT Ventricular Rate:  94 PR Interval:    QRS Duration: 76 QT Interval:  363 QTC Calculation: 454 R Axis:   -90 Text Interpretation:  Sinus rhythm Probable left atrial enlargement Left anterior fascicular block Abnormal R-wave progression, early transition Nonspecific T abnormalities, lateral leads Baseline wander in lead(s) V1 No old tracing to compare Confirmed by Sherwood Gambler [phone]) on 07/05/2019 1:46:33 PM   Radiology No results found.  Procedures Procedures (including critical care time)  Medications Ordered in ED Medications  sodium chloride 0.9 % bolus 1,000 mL (0 mLs Intravenous Stopped 07/05/19 1521)     Initial Impression / Assessment and Plan / ED Course  I have reviewed the triage vital signs and the nursing notes.  Pertinent labs & imaging results that were available during my care of the patient were reviewed by me and considered in my medical decision making (see chart for details).        Patient is well-appearing here.  I discussed his case with his doctor, Dr. Jacelyn Grip.  The murmur is apparently new.  Unclear if this is 100% related to his near syncope today but certainly in his age range this is somewhat concerning.  I discussed admission but the patient wants to go home.  We discussed that this could have been heart related or arrhythmia related.  He understands this but  still wants to go home.  We discussed strict return precautions.  Will order outpatient echo for Dr. Jacelyn Grip and advise close follow-up with his PCP.  Final Clinical Impressions(s) / ED Diagnoses   Final diagnoses:  Near syncope    ED Discharge Orders         Ordered    ECHOCARDIOGRAM COMPLETE     07/05/19 1512           Sherwood Gambler, MD 07/05/19 1546

## 2019-07-05 NOTE — ED Triage Notes (Signed)
Pt c/o dizziness with fall today denies syncope episode

## 2019-07-05 NOTE — Discharge Instructions (Signed)
If you develop chest pain, shortness of breath, severe headache, recurrent dizziness or lightheadedness, palpitations or any other new/concerning symptoms then return to the ER for evaluation.

## 2019-07-10 DIAGNOSIS — I6523 Occlusion and stenosis of bilateral carotid arteries: Secondary | ICD-10-CM | POA: Diagnosis not present

## 2019-07-10 DIAGNOSIS — R000 Tachycardia, unspecified: Secondary | ICD-10-CM | POA: Diagnosis not present

## 2019-07-10 DIAGNOSIS — R55 Syncope and collapse: Secondary | ICD-10-CM | POA: Diagnosis not present

## 2019-07-10 DIAGNOSIS — R011 Cardiac murmur, unspecified: Secondary | ICD-10-CM | POA: Diagnosis not present

## 2019-08-03 ENCOUNTER — Ambulatory Visit: Payer: Medicare HMO | Admitting: Gastroenterology

## 2019-08-06 ENCOUNTER — Ambulatory Visit (INDEPENDENT_AMBULATORY_CARE_PROVIDER_SITE_OTHER): Payer: Medicare HMO | Admitting: Cardiology

## 2019-08-06 ENCOUNTER — Encounter: Payer: Self-pay | Admitting: Cardiology

## 2019-08-06 ENCOUNTER — Other Ambulatory Visit: Payer: Self-pay

## 2019-08-06 VITALS — BP 148/94 | HR 104 | Temp 97.8°F | Ht 69.0 in | Wt 148.6 lb

## 2019-08-06 DIAGNOSIS — I10 Essential (primary) hypertension: Secondary | ICD-10-CM

## 2019-08-06 DIAGNOSIS — R55 Syncope and collapse: Secondary | ICD-10-CM

## 2019-08-06 MED ORDER — LISINOPRIL 5 MG PO TABS: 5.0000 mg | ORAL_TABLET | Freq: Every day | ORAL | 3 refills | Status: DC

## 2019-08-06 NOTE — Progress Notes (Signed)
Follow up visit  Subjective:   Ian [name], male    DOB: 07/23/1951, 68 y.o.   MRN: [id number]   Chief Complaint  Patient presents with  . Loss of Consciousness  . Coronary Artery Disease    HPI  68 year old Caucasian male with asymptomatic carotid artery stenosis, now referred for evaluation of near syncope.  Stated episode occurred about 3 weeks ago.  Patient had just taken a hot shower and was walking out to his car on a hot day.  He had not had anything to eat or drink.  He remembers feeling extremely hot followed by lightheadedness.  He was able to abort fall by stretching his arm out.  He did not lose consciousness completely.  He has not had any recurrence of these symptoms since then.  At work, he walks several 100 yards every day without any symptoms of chest pain, shortness of breath, lightheadedness.  Orthostatic vitals are negative today.  Blood pressure is elevated.    Past Medical History:  Diagnosis Date  . Hypercholesteremia      Past Surgical History:  Procedure Laterality Date  . NOSE SURGERY       Social History   Socioeconomic History  . Marital status: Married    Spouse name: Not on file  . Number of children: 0  . Years of education: 16  . Highest education level: Bachelor's degree (e.g., BA, AB, BS)  Occupational History  . Occupation: Quality control  Social Needs  . Financial resource strain: Not on file  . Food insecurity    Worry: Not on file    Inability: Not on file  . Transportation needs    Medical: Not on file    Non-medical: Not on file  Tobacco Use  . Smoking status: Former Research scientist (life sciences)  . Smokeless tobacco: Never Used  Substance and Sexual Activity  . Alcohol use: No    Frequency: Never  . Drug use: No  . Sexual activity: Not on file  Lifestyle  . Physical activity    Days per week: Not on file    Minutes per session: Not on file  . Stress: Not on file  Relationships  . Social Herbalist on phone: Not  on file    Gets together: Not on file    Attends religious service: Not on file    Active member of club or organization: Not on file    Attends meetings of clubs or organizations: Not on file    Relationship status: Not on file  . Intimate partner violence    Fear of current or ex partner: Not on file    Emotionally abused: Not on file    Physically abused: Not on file    Forced sexual activity: Not on file  Other Topics Concern  . Not on file  Social History Narrative   Lives at home with his wife.   Right-handed.   No daily use of caffeine.     Family History  Problem Relation Age of Onset  . Colon cancer Mother   . Bladder Cancer Father      Current Outpatient Medications on File Prior to Visit  Medication Sig Dispense Refill  . co-enzyme Q-10 30 MG capsule Take 100 mg by mouth daily.    . Multiple Vitamins-Minerals (MULTIVITAMIN WITH MINERALS) tablet Take 1 tablet by mouth daily.    Marland Kitchen UNABLE TO FIND Take 2 tablets by mouth daily. Super Beta Prostate  No current facility-administered medications on file prior to visit.     Cardiovascular studies:  EKG 08/06/2019:  Sinus rhythm 94 bpm.  Left posterior fascicular block.  Carotid artery duplex 01/02/2019: Minimal stenosis in the right internal carotid artery (1-15%).  Stenosis in the right common carotid artery (<50%). Stenosis in the right external carotid artery (<50%). Minimal stenosis in the left internal carotid artery (1-15%). Stenosis in the left common carotid artery (<50%). Stenosis in the left external carotid artery (<50%). Antegrade right vertebral artery flow. Antegrade left vertebral artery flow. Compared to the study done on 08/02/2018, right ICA velocity is decreased and stenosis range of greater than 50% is now less than 50% throughout.  Significant improvement in plaque burden. Follow up in one year is appropriate if clinically indicated.  Hospital echocardiogram 11/10/2017:  - Left ventricle:  mild inferior wall hypokinesis. The cavity size   was normal. Systolic function was normal. The estimated ejection   fraction was in the range of 50% to 55%. Wall motion was normal;   there were no regional wall motion abnormalities. Left   ventricular diastolic function parameters were normal. - Left atrium: The atrium was mildly dilated. - Right ventricle: The cavity size was mildly dilated. Wall   thickness was normal. - Right atrium: The atrium was mildly dilated. - Atrial septum: No defect or patent foramen ovale was identified.     Recent labs: Not available   Review of Systems  Constitution: Negative for decreased appetite, malaise/fatigue, weight gain and weight loss.  HENT: Negative for congestion.   Eyes: Negative for visual disturbance.  Cardiovascular: Positive for near-syncope (As per HPI). Negative for chest pain, dyspnea on exertion, palpitations and syncope.  Respiratory: Negative for cough.   Endocrine: Negative for cold intolerance.  Hematologic/Lymphatic: Does not bruise/bleed easily.  Skin: Negative for itching and rash.  Musculoskeletal: Negative for myalgias.  Gastrointestinal: Negative for abdominal pain, nausea and vomiting.  Genitourinary: Negative for dysuria.  Neurological: Positive for light-headedness (As per HPI). Negative for dizziness and weakness.  Psychiatric/Behavioral: The patient is not nervous/anxious.   All other systems reviewed and are negative.        Vitals:   08/06/19 1538  BP: (!) 148/94  Pulse: (!) 104  Temp: 97.8 F (36.6 C)  SpO2: 99%   Orthostatic vitals:  Supine: 147/100 mmHg, HR 106/min Sitting: 148/94 mmHg, HR 103/min Standing: 135/90 mmHg, HR 116/min   Body mass index is 21.94 kg/m. Filed Weights   08/06/19 1538  Weight: 148 lb 9.6 oz (67.4 kg)   Objective:   Physical Exam  Constitutional: He is oriented to person, place, and time. He appears well-developed and well-nourished. No distress.  HENT:  Head:  Normocephalic and atraumatic.  Eyes: Pupils are equal, round, and reactive to light. Conjunctivae are normal.  Neck: No JVD present.  Cardiovascular: Normal rate, regular rhythm and intact distal pulses.  No murmur heard. Pulmonary/Chest: Effort normal and breath sounds normal. He has no wheezes. He has no rales.  Abdominal: Soft. Bowel sounds are normal. There is no rebound.  Musculoskeletal:        General: No edema.  Lymphadenopathy:    He has no cervical adenopathy.  Neurological: He is alert and oriented to person, place, and time. No cranial nerve deficit.  Skin: Skin is warm and dry.  Psychiatric: He has a normal mood and affect.  Nursing note and vitals reviewed.         Assessment & Recommendations:   68  year old Caucasian male with asymptomatic carotid artery stenosis  Near syncope/lightheadedness: Episode typical of heat exhaustion and vasovagal in etiology.  Normal physical exam.  Structurally normal heart with only minimal coronary artery disease.  Recommend liberal hydration, counterpressure maneuvers to avoid syncopal episode.  Carotid artery stenosis: Asymptomatic, moderate right, mild left carotid artery stenosis. Recommend Crestor 20 mg daily.   Hypertension: New diagnosis.  Started lisinopril 5 mg daily.  Recheck BMP in 1 week.  Blood pressure follow-up in 2 weeks.    Nigel Mormon, MD [facility] Pager: [phone] Office: [phone] If no answer Cell [phone]

## 2019-08-14 DIAGNOSIS — I10 Essential (primary) hypertension: Secondary | ICD-10-CM | POA: Diagnosis not present

## 2019-08-15 LAB — BASIC METABOLIC PANEL
BUN/Creatinine Ratio: 25 — ABNORMAL HIGH (ref 10–24)
BUN: 31 mg/dL — ABNORMAL HIGH (ref 8–27)
CO2: 21 mmol/L (ref 20–29)
Calcium: 9.7 mg/dL (ref 8.6–10.2)
Chloride: 98 mmol/L (ref 96–106)
Creatinine, Ser: 1.26 mg/dL (ref 0.76–1.27)
GFR calc Af Amer: 68 mL/min/{1.73_m2} (ref >59)
GFR calc non Af Amer: 59 mL/min/{1.73_m2} — ABNORMAL LOW (ref >59)
Glucose: 134 mg/dL — ABNORMAL HIGH (ref 65–99)
Potassium: 4.9 mmol/L (ref 3.5–5.2)
Sodium: 137 mmol/L (ref 134–144)

## 2019-08-20 ENCOUNTER — Ambulatory Visit (INDEPENDENT_AMBULATORY_CARE_PROVIDER_SITE_OTHER): Payer: Medicare HMO | Admitting: Cardiology

## 2019-08-20 ENCOUNTER — Other Ambulatory Visit: Payer: Self-pay

## 2019-08-20 VITALS — BP 123/88 | HR 134

## 2019-08-20 DIAGNOSIS — R000 Tachycardia, unspecified: Secondary | ICD-10-CM | POA: Insufficient documentation

## 2019-08-20 DIAGNOSIS — I10 Essential (primary) hypertension: Secondary | ICD-10-CM | POA: Diagnosis not present

## 2019-08-20 NOTE — Progress Notes (Signed)
EKG 08/20/2019: Sinus tachycardia 115 bpm. Incomplete right bundle branch block Left anterior fascicular block.  Left atrial enlargement.   BP well controlled. Tachycardia improved on EKG. Patient endorses anxiety and stress related to recent job stress. He denies chest pain, shortness of breath, palpitations, leg edema, orthopnea, PND, TIA/syncope.  F/u in 3 months.

## 2019-08-21 ENCOUNTER — Telehealth: Payer: Self-pay | Admitting: Cardiology

## 2019-08-21 DIAGNOSIS — I10 Essential (primary) hypertension: Secondary | ICD-10-CM

## 2019-08-21 NOTE — Telephone Encounter (Signed)
Creatinine has increase from 0.97 to 1.26. This most likely suggests he is slightly dehydrated. Recommend increased hydration (drink more fluids). Will repeat BMP in 2 weeks.  Thanks MJP

## 2019-08-31 ENCOUNTER — Telehealth: Payer: Self-pay

## 2019-08-31 DIAGNOSIS — N179 Acute kidney failure, unspecified: Secondary | ICD-10-CM

## 2019-08-31 NOTE — Telephone Encounter (Signed)
Patient wife is requesting lab results from 9/08. Please advise.

## 2019-08-31 NOTE — Telephone Encounter (Signed)
Okay. Please call and transfer to me. Thanks

## 2019-08-31 NOTE — Telephone Encounter (Signed)
Patient has signed DPR giving permission to discuss results with his wife. Thank you

## 2019-08-31 NOTE — Telephone Encounter (Signed)
Cr increased from 0.93 to 1.26. Recommend hydration. Repeat BMP Pin 2 wks, followed by office visit. Discussed with patient's wife.   Vernell Leep, MD

## 2019-09-05 ENCOUNTER — Other Ambulatory Visit (HOSPITAL_COMMUNITY): Payer: Self-pay | Admitting: Cardiology

## 2019-09-05 DIAGNOSIS — I10 Essential (primary) hypertension: Secondary | ICD-10-CM | POA: Diagnosis not present

## 2019-09-06 LAB — BASIC METABOLIC PANEL
BUN/Creatinine Ratio: 22 (ref 10–24)
BUN: 30 mg/dL — ABNORMAL HIGH (ref 8–27)
CO2: 21 mmol/L (ref 20–29)
Calcium: 9.7 mg/dL (ref 8.6–10.2)
Chloride: 102 mmol/L (ref 96–106)
Creatinine, Ser: 1.35 mg/dL — ABNORMAL HIGH (ref 0.76–1.27)
GFR calc Af Amer: 63 mL/min/{1.73_m2} (ref >59)
GFR calc non Af Amer: 54 mL/min/{1.73_m2} — ABNORMAL LOW (ref >59)
Glucose: 172 mg/dL — ABNORMAL HIGH (ref 65–99)
Potassium: 4.5 mmol/L (ref 3.5–5.2)
Sodium: 141 mmol/L (ref 134–144)

## 2019-09-07 ENCOUNTER — Other Ambulatory Visit: Payer: Self-pay

## 2019-09-07 ENCOUNTER — Ambulatory Visit: Payer: Medicare HMO | Admitting: Cardiology

## 2019-09-07 ENCOUNTER — Encounter: Payer: Self-pay | Admitting: Cardiology

## 2019-09-07 VITALS — BP 135/94 | HR 97 | Temp 97.5°F | Ht 69.0 in | Wt 140.0 lb

## 2019-09-07 DIAGNOSIS — N179 Acute kidney failure, unspecified: Secondary | ICD-10-CM | POA: Insufficient documentation

## 2019-09-07 DIAGNOSIS — I10 Essential (primary) hypertension: Secondary | ICD-10-CM | POA: Diagnosis not present

## 2019-09-07 DIAGNOSIS — R55 Syncope and collapse: Secondary | ICD-10-CM

## 2019-09-07 MED ORDER — AMLODIPINE BESYLATE 5 MG PO TABS: 5.0000 mg | ORAL_TABLET | Freq: Every day | ORAL | 3 refills | Status: DC

## 2019-09-07 NOTE — Progress Notes (Signed)
Follow up visit  Subjective:   Ian [name], male    DOB: 1951/06/05, 68 y.o.   MRN: [id number]   Chief Complaint  Patient presents with  . Follow-up    Acute Kidney injury  . Hypertension  . Tachycardia    HPI  68 year old Caucasian male with asymptomatic carotid artery stenosis, h/o near syncope. hypertension.  Patient was started on lisinopril 5 mg daily. His Cr has since increased from 0.93 to 1.26 to 1.35.   Patient has been doing well and denies any complains. He tells me that his Creatinine is  "always abnormal". His blood pressure is slightly suboptimal. He is not tachycardiac today.    Past Medical History:  Diagnosis Date  . Hypercholesteremia      Past Surgical History:  Procedure Laterality Date  . NOSE SURGERY       Social History   Socioeconomic History  . Marital status: Married    Spouse name: Not on file  . Number of children: 0  . Years of education: 16  . Highest education level: Bachelor's degree (e.g., BA, AB, BS)  Occupational History  . Occupation: Quality control  Social Needs  . Financial resource strain: Not on file  . Food insecurity    Worry: Not on file    Inability: Not on file  . Transportation needs    Medical: Not on file    Non-medical: Not on file  Tobacco Use  . Smoking status: Former Smoker    Packs/day: 0.50    Years: 40.00    Pack years: 20.00    Types: Cigarettes    Quit date: 2015    Years since quitting: 5.7  . Smokeless tobacco: Never Used  Substance and Sexual Activity  . Alcohol use: Yes    Frequency: Never    Comment: occ  . Drug use: No  . Sexual activity: Not on file  Lifestyle  . Physical activity    Days per week: Not on file    Minutes per session: Not on file  . Stress: Not on file  Relationships  . Social Herbalist on phone: Not on file    Gets together: Not on file    Attends religious service: Not on file    Active member of club or organization: Not on file   Attends meetings of clubs or organizations: Not on file    Relationship status: Not on file  . Intimate partner violence    Fear of current or ex partner: Not on file    Emotionally abused: Not on file    Physically abused: Not on file    Forced sexual activity: Not on file  Other Topics Concern  . Not on file  Social History Narrative   Lives at home with his wife.   Right-handed.   No daily use of caffeine.     Family History  Problem Relation Age of Onset  . Colon cancer Mother   . Bladder Cancer Father      Current Outpatient Medications on File Prior to Visit  Medication Sig Dispense Refill  . co-enzyme Q-10 30 MG capsule Take 100 mg by mouth daily.    Marland Kitchen lisinopril (ZESTRIL) 5 MG tablet Take 1 tablet (5 mg total) by mouth daily. 30 tablet 3  . Multiple Vitamins-Minerals (MULTIVITAMIN WITH MINERALS) tablet Take 1 tablet by mouth daily.    Marland Kitchen UNABLE TO FIND Take 2 tablets by mouth daily. Super Beta Prostate  No current facility-administered medications on file prior to visit.     Cardiovascular studies:  EKG 09/07/2019: Sinus rhythm 96 bpm. Incomplete right bundle branch block Left anterior fascicular block.  Left atrial enlargement.  COmpared to previous EKG on 08/20/2019, rate is slower.   Carotid artery duplex 01/02/2019: Minimal stenosis in the right internal carotid artery (1-15%).  Stenosis in the right common carotid artery (<50%). Stenosis in the right external carotid artery (<50%). Minimal stenosis in the left internal carotid artery (1-15%). Stenosis in the left common carotid artery (<50%). Stenosis in the left external carotid artery (<50%). Antegrade right vertebral artery flow. Antegrade left vertebral artery flow. Compared to the study done on 08/02/2018, right ICA velocity is decreased and stenosis range of greater than 50% is now less than 50% throughout.  Significant improvement in plaque burden. Follow up in one year is appropriate if clinically  indicated.  Hospital echocardiogram 11/10/2017:  - Left ventricle: mild inferior wall hypokinesis. The cavity size   was normal. Systolic function was normal. The estimated ejection   fraction was in the range of 50% to 55%. Wall motion was normal;   there were no regional wall motion abnormalities. Left   ventricular diastolic function parameters were normal. - Left atrium: The atrium was mildly dilated. - Right ventricle: The cavity size was mildly dilated. Wall   thickness was normal. - Right atrium: The atrium was mildly dilated. - Atrial septum: No defect or patent foramen ovale was identified.     Recent labs: Results for ALANZO, GARRETTE (MRN [id number]) as of 09/07/2019 08:56  Ref. Range 07/05/2019 13:42 08/14/2019 10:59 09/05/2019 13:26  Sodium Latest Ref Range: 134 - 144 mmol/L 138 137 141  Potassium Latest Ref Range: 3.5 - 5.2 mmol/L 3.6 4.9 4.5  Chloride Latest Ref Range: 96 - 106 mmol/L 102 98 102  CO2 Latest Ref Range: 20 - 29 mmol/L 23 21 21   Glucose Latest Ref Range: 65 - 99 mg/dL 100 (H) 134 (H) 172 (H)  BUN Latest Ref Range: 8 - 27 mg/dL 29 (H) 31 (H) 30 (H)  Creatinine Latest Ref Range: 0.76 - 1.27 mg/dL 0.93 1.26 1.35 (H)  Calcium Latest Ref Range: 8.6 - 10.2 mg/dL 9.0 9.7 9.7  Anion gap Latest Ref Range: 5 - 15  13    BUN/Creatinine Ratio Latest Ref Range: 10 - 24   25 (H) 22  Alkaline Phosphatase Latest Ref Range: 38 - 126 U/L 48    Albumin Latest Ref Range: 3.5 - 5.0 g/dL 3.7    AST Latest Ref Range: 15 - 41 U/L 24    ALT Latest Ref Range: 0 - 44 U/L 22    Total Protein Latest Ref Range: 6.5 - 8.1 g/dL 6.9    Total Bilirubin Latest Ref Range: 0.3 - 1.2 mg/dL 0.5    GFR, Est Non African American Latest Ref Range: >59 mL/min/1.73 >60 59 (L) 54 (L)  GFR, Est African American Latest Ref Range: >59 mL/min/1.73 >60 68 63  Troponin I (High Sensitivity) Latest Ref Range: <18 ng/L 9       Review of Systems  Constitution: Negative for decreased appetite,  malaise/fatigue, weight gain and weight loss.  HENT: Negative for congestion.   Eyes: Negative for visual disturbance.  Cardiovascular: Negative for chest pain, dyspnea on exertion, near-syncope, palpitations and syncope.  Respiratory: Negative for cough.   Endocrine: Negative for cold intolerance.  Hematologic/Lymphatic: Does not bruise/bleed easily.  Skin: Negative for itching and rash.  Musculoskeletal:  Negative for myalgias.  Gastrointestinal: Negative for abdominal pain, nausea and vomiting.  Genitourinary: Negative for dysuria.  Neurological: Positive for light-headedness (As per HPI). Negative for dizziness and weakness.  Psychiatric/Behavioral: The patient is not nervous/anxious.   All other systems reviewed and are negative.       Vitals:   09/07/19 0935  BP: (!) 135/94  Pulse: 97  Temp: (!) 97.5 F (36.4 C)  SpO2: 97%     Body mass index is 20.67 kg/m. Filed Weights   09/07/19 0935  Weight: 63.5 kg     Objective:   Physical Exam  Constitutional: He is oriented to person, place, and time. He appears well-developed and well-nourished. No distress.  HENT:  Head: Normocephalic and atraumatic.  Eyes: Pupils are equal, round, and reactive to light. Conjunctivae are normal.  Neck: No JVD present.  Cardiovascular: Normal rate, regular rhythm and intact distal pulses.  No murmur heard. Pulmonary/Chest: Effort normal and breath sounds normal. He has no wheezes. He has no rales.  Abdominal: Soft. Bowel sounds are normal. There is no rebound.  Musculoskeletal:        General: No edema.  Lymphadenopathy:    He has no cervical adenopathy.  Neurological: He is alert and oriented to person, place, and time. No cranial nerve deficit.  Skin: Skin is warm and dry.  Psychiatric: He has a normal mood and affect.  Nursing note and vitals reviewed.         Assessment & Recommendations:   68 year old Caucasian male with asymptomatic carotid artery stenosis, h/o near  syncope, hypertension, acute kidney injury  Acute kidney injury: Possibly related to lisinopril. Stop lisinopril. Recommend liberal hydration. Repeat BMP in 4 weeks. If still elevated, may need nephrology referral and workup.   Near syncope/lightheadedness: Episode typical of heat exhaustion and vasovagal in etiology. No recurrence.   Carotid artery stenosis: Asymptomatic, moderate right, mild left carotid artery stenosis. Recommend Crestor 20 mg daily.   Hypertension: Stopped lisinopril. Started amlodipine 5 mg daily.  F/u in 4 weeks after BMP and lipid panel.   Nigel Mormon, MD [facility] Pager: [phone] Office: [phone] If no answer Cell [phone]

## 2019-09-12 ENCOUNTER — Ambulatory Visit: Payer: Medicare HMO | Admitting: Cardiology

## 2019-09-17 ENCOUNTER — Other Ambulatory Visit (HOSPITAL_COMMUNITY): Payer: Self-pay | Admitting: Cardiology

## 2019-09-17 DIAGNOSIS — E875 Hyperkalemia: Secondary | ICD-10-CM

## 2019-09-17 DIAGNOSIS — I10 Essential (primary) hypertension: Secondary | ICD-10-CM | POA: Diagnosis not present

## 2019-09-18 LAB — BASIC METABOLIC PANEL
BUN/Creatinine Ratio: 29 — ABNORMAL HIGH (ref 10–24)
BUN: 33 mg/dL — ABNORMAL HIGH (ref 8–27)
CO2: 24 mmol/L (ref 20–29)
Calcium: 10 mg/dL (ref 8.6–10.2)
Chloride: 101 mmol/L (ref 96–106)
Creatinine, Ser: 1.15 mg/dL (ref 0.76–1.27)
GFR calc Af Amer: 76 mL/min/{1.73_m2} (ref >59)
GFR calc non Af Amer: 66 mL/min/{1.73_m2} (ref >59)
Glucose: 128 mg/dL — ABNORMAL HIGH (ref 65–99)
Potassium: 5.5 mmol/L — ABNORMAL HIGH (ref 3.5–5.2)
Sodium: 140 mmol/L (ref 134–144)

## 2019-09-18 MED ORDER — FUROSEMIDE 20 MG PO TABS: 20.0000 mg | ORAL_TABLET | Freq: Every day | ORAL | 0 refills | Status: DC

## 2019-09-18 NOTE — Addendum Note (Signed)
Addended by: Nigel Mormon on: 09/18/2019 09:01 PM   Modules accepted: Orders

## 2019-09-19 ENCOUNTER — Telehealth: Payer: Self-pay

## 2019-09-19 NOTE — Telephone Encounter (Signed)
Is there any reason I shouldn't discuss the results and medications with the patient? I did so at last visit.   Thanks MJP

## 2019-09-19 NOTE — Telephone Encounter (Signed)
Telephone encounter:  Reason for call: Patients wife called and was insist ant that you call her regarding the new meds you prescribed for him lasix & amlodipine. She also questioned why she wasn't called about his results from his blood work. Please call her @ [phone]  Usual provider: MP  Last office visit: 09/07/19  Next office visit: 11/22/19   Last hospitalization: 07/05/19  Current Outpatient Medications on File Prior to Visit  Medication Sig Dispense Refill  . amLODipine (NORVASC) 5 MG tablet Take 1 tablet (5 mg total) by mouth daily. 30 tablet 3  . co-enzyme Q-10 30 MG capsule Take 100 mg by mouth daily.    . furosemide (LASIX) 20 MG tablet Take 1 tablet (20 mg total) by mouth daily. 5 tablet 0  . Multiple Vitamins-Minerals (MULTIVITAMIN WITH MINERALS) tablet Take 1 tablet by mouth daily.     No current facility-administered medications on file prior to visit.

## 2019-09-19 NOTE — Telephone Encounter (Signed)
Spoke with wife. Explained the rationale.

## 2019-09-19 NOTE — Telephone Encounter (Signed)
I am not sure, but she called for him I guess and was only wanting to speak to you

## 2019-09-27 ENCOUNTER — Other Ambulatory Visit: Payer: Self-pay | Admitting: Cardiology

## 2019-09-27 ENCOUNTER — Telehealth: Payer: Self-pay

## 2019-09-27 DIAGNOSIS — E782 Mixed hyperlipidemia: Secondary | ICD-10-CM

## 2019-09-27 DIAGNOSIS — E875 Hyperkalemia: Secondary | ICD-10-CM

## 2019-09-27 NOTE — Telephone Encounter (Signed)
Telephone encounter:  Reason for call:Wife wants to know if patient should go have have labs drawn. He finished lasix 3 days ago. Please advise  Usual provider: Dr.Patwardhan  Last office visit: 09/07/19   Next office visit: 10/05/19   Last hospitalization: unknown   Current Outpatient Medications on File Prior to Visit  Medication Sig Dispense Refill  . amLODipine (NORVASC) 5 MG tablet Take 1 tablet (5 mg total) by mouth daily. 30 tablet 3  . co-enzyme Q-10 30 MG capsule Take 100 mg by mouth daily.    . furosemide (LASIX) 20 MG tablet Take 1 tablet (20 mg total) by mouth daily. 5 tablet 0  . Multiple Vitamins-Minerals (MULTIVITAMIN WITH MINERALS) tablet Take 1 tablet by mouth daily.     No current facility-administered medications on file prior to visit.

## 2019-09-27 NOTE — Addendum Note (Signed)
Addended by: Nigel Mormon on: 09/27/2019 09:58 AM   Modules accepted: Orders

## 2019-09-27 NOTE — Telephone Encounter (Signed)
Informed wife husband can go ahead and have labs drawn. She verbalized understanding

## 2019-09-27 NOTE — Telephone Encounter (Signed)
Yes. BMP and lipid panel is ordered.  Thanks MJP

## 2019-09-28 LAB — BASIC METABOLIC PANEL
BUN/Creatinine Ratio: 32 — ABNORMAL HIGH (ref 10–24)
BUN: 39 mg/dL — ABNORMAL HIGH (ref 8–27)
CO2: 23 mmol/L (ref 20–29)
Calcium: 10 mg/dL (ref 8.6–10.2)
Chloride: 101 mmol/L (ref 96–106)
Creatinine, Ser: 1.21 mg/dL (ref 0.76–1.27)
GFR calc Af Amer: 71 mL/min/{1.73_m2} (ref >59)
GFR calc non Af Amer: 62 mL/min/{1.73_m2} (ref >59)
Glucose: 130 mg/dL — ABNORMAL HIGH (ref 65–99)
Potassium: 5.4 mmol/L — ABNORMAL HIGH (ref 3.5–5.2)
Sodium: 138 mmol/L (ref 134–144)

## 2019-09-28 LAB — LIPID PANEL
Chol/HDL Ratio: 3.2 ratio (ref 0.0–5.0)
Cholesterol, Total: 229 mg/dL — ABNORMAL HIGH (ref 100–199)
HDL: 71 mg/dL (ref >39)
LDL Chol Calc (NIH): 137 mg/dL — ABNORMAL HIGH (ref 0–99)
Triglycerides: 118 mg/dL (ref 0–149)
VLDL Cholesterol Cal: 21 mg/dL (ref 5–40)

## 2019-09-28 NOTE — Progress Notes (Signed)
Pt aware.

## 2019-10-04 DIAGNOSIS — E875 Hyperkalemia: Secondary | ICD-10-CM | POA: Insufficient documentation

## 2019-10-04 NOTE — Progress Notes (Signed)
Follow up visit  Subjective:   Ian [name], male    DOB: 1951/10/09, 68 y.o.   MRN: [id number]   Chief Complaint  Patient presents with  . Hypertension    HPI  68 year old Caucasian male with asymptomatic carotid artery stenosis, h/o near syncope, hypertension.  Patient was started on lisinopril 5 mg daily. His Cr has since increased from 0.93 to 1.26 to 1.35.  After stopping lisinopril, Cr improved (1.21-1.15), K remains elevated (5.5-5.4). Lipid panel shows elevated Chol (229), and LDL (137), with excellent HDL (71). On amlodipine 5 mg daily, blood pressure has remains elevated.    Past Medical History:  Diagnosis Date  . Hypercholesteremia      Past Surgical History:  Procedure Laterality Date  . NOSE SURGERY       Social History   Socioeconomic History  . Marital status: Married    Spouse name: Not on file  . Number of children: 0  . Years of education: 16  . Highest education level: Bachelor's degree (e.g., BA, AB, BS)  Occupational History  . Occupation: Quality control  Social Needs  . Financial resource strain: Not on file  . Food insecurity    Worry: Not on file    Inability: Not on file  . Transportation needs    Medical: Not on file    Non-medical: Not on file  Tobacco Use  . Smoking status: Former Smoker    Packs/day: 0.50    Years: 40.00    Pack years: 20.00    Types: Cigarettes    Quit date: 2015    Years since quitting: 5.8  . Smokeless tobacco: Never Used  Substance and Sexual Activity  . Alcohol use: Yes    Frequency: Never    Comment: occ  . Drug use: No  . Sexual activity: Not on file  Lifestyle  . Physical activity    Days per week: Not on file    Minutes per session: Not on file  . Stress: Not on file  Relationships  . Social Herbalist on phone: Not on file    Gets together: Not on file    Attends religious service: Not on file    Active member of club or organization: Not on file    Attends meetings  of clubs or organizations: Not on file    Relationship status: Not on file  . Intimate partner violence    Fear of current or ex partner: Not on file    Emotionally abused: Not on file    Physically abused: Not on file    Forced sexual activity: Not on file  Other Topics Concern  . Not on file  Social History Narrative   Lives at home with his wife.   Right-handed.   No daily use of caffeine.     Family History  Problem Relation Age of Onset  . Colon cancer Mother   . Bladder Cancer Father      Current Outpatient Medications on File Prior to Visit  Medication Sig Dispense Refill  . amLODipine (NORVASC) 5 MG tablet Take 1 tablet (5 mg total) by mouth daily. 30 tablet 3  . co-enzyme Q-10 30 MG capsule Take 100 mg by mouth daily.    . furosemide (LASIX) 20 MG tablet Take 1 tablet (20 mg total) by mouth daily. 5 tablet 0  . Multiple Vitamins-Minerals (MULTIVITAMIN WITH MINERALS) tablet Take 1 tablet by mouth daily.     No  current facility-administered medications on file prior to visit.     Cardiovascular studies:  EKG 09/07/2019: Sinus rhythm 96 bpm. Incomplete right bundle branch block Left anterior fascicular block.  Left atrial enlargement.  COmpared to previous EKG on 08/20/2019, rate is slower.   Carotid artery duplex 01/02/2019: Minimal stenosis in the right internal carotid artery (1-15%).  Stenosis in the right common carotid artery (<50%). Stenosis in the right external carotid artery (<50%). Minimal stenosis in the left internal carotid artery (1-15%). Stenosis in the left common carotid artery (<50%). Stenosis in the left external carotid artery (<50%). Antegrade right vertebral artery flow. Antegrade left vertebral artery flow. Compared to the study done on 08/02/2018, right ICA velocity is decreased and stenosis range of greater than 50% is now less than 50% throughout.  Significant improvement in plaque burden. Follow up in one year is appropriate if  clinically indicated.  Hospital echocardiogram 11/10/2017:  - Left ventricle: mild inferior wall hypokinesis. The cavity size   was normal. Systolic function was normal. The estimated ejection   fraction was in the range of 50% to 55%. Wall motion was normal;   there were no regional wall motion abnormalities. Left   ventricular diastolic function parameters were normal. - Left atrium: The atrium was mildly dilated. - Right ventricle: The cavity size was mildly dilated. Wall   thickness was normal. - Right atrium: The atrium was mildly dilated. - Atrial septum: No defect or patent foramen ovale was identified.     Recent labs: Results for Ian, [name] (MRN [id number]) as of 10/04/2019 16:54  Ref. Range 09/17/2019 10:50 09/27/2019 10:51 09/27/2019 10:52  Sodium Latest Ref Range: 134 - 144 mmol/L 140  138  Potassium Latest Ref Range: 3.5 - 5.2 mmol/L 5.5 (H)  5.4 (H)  Chloride Latest Ref Range: 96 - 106 mmol/L 101  101  CO2 Latest Ref Range: 20 - 29 mmol/L 24  23  Glucose Latest Ref Range: 65 - 99 mg/dL 128 (H)  130 (H)  BUN Latest Ref Range: 8 - 27 mg/dL 33 (H)  39 (H)  Creatinine Latest Ref Range: 0.76 - 1.27 mg/dL 1.15  1.21  Calcium Latest Ref Range: 8.6 - 10.2 mg/dL 10.0  10.0  BUN/Creatinine Ratio Latest Ref Range: 10 - 24  29 (H)  32 (H)  GFR, Est Non African American Latest Ref Range: >59 mL/min/1.73 66  62  GFR, Est African American Latest Ref Range: >59 mL/min/1.73 76  71   Results for Ian, [name] (MRN [id number]) as of 10/04/2019 16:54  Ref. Range 09/27/2019 10:51  Total CHOL/HDL Ratio Latest Ref Range: 0.0 - 5.0 ratio 3.2  Cholesterol, Total Latest Ref Range: 100 - 199 mg/dL 229 (H)  HDL Cholesterol Latest Ref Range: >39 mg/dL 71  Triglycerides Latest Ref Range: 0 - 149 mg/dL 118  VLDL Cholesterol Cal Latest Ref Range: 5 - 40 mg/dL 21  LDL Chol Calc (NIH) Latest Ref Range: 0 - 99 mg/dL 137 (H)    Review of Systems  Constitution: Negative for decreased  appetite, malaise/fatigue, weight gain and weight loss.  HENT: Negative for congestion.   Eyes: Negative for visual disturbance.  Cardiovascular: Negative for chest pain, dyspnea on exertion, near-syncope, palpitations and syncope.  Respiratory: Negative for cough.   Endocrine: Negative for cold intolerance.  Hematologic/Lymphatic: Does not bruise/bleed easily.  Skin: Negative for itching and rash.  Musculoskeletal: Negative for myalgias.  Gastrointestinal: Negative for abdominal pain, nausea and vomiting.  Genitourinary: Negative for dysuria.  Neurological: Positive for light-headedness (As per HPI). Negative for dizziness and weakness.  Psychiatric/Behavioral: The patient is not nervous/anxious.   All other systems reviewed and are negative.       Vitals:   10/05/19 0929  BP: (!) 142/96  Pulse: (!) 106  Temp: (!) 97.3 F (36.3 C)  SpO2: 98%     Body mass index is 21.01 kg/m. Filed Weights   10/05/19 0929  Weight: 142 lb 4.8 oz (64.5 kg)     Objective:   Physical Exam  Constitutional: He is oriented to person, place, and time. He appears well-developed and well-nourished. No distress.  HENT:  Head: Normocephalic and atraumatic.  Eyes: Pupils are equal, round, and reactive to light. Conjunctivae are normal.  Neck: No JVD present.  Cardiovascular: Normal rate, regular rhythm and intact distal pulses.  No murmur heard. Pulses:      Carotid pulses are on the right side with bruit and on the left side with bruit. Pulmonary/Chest: Effort normal and breath sounds normal. He has no wheezes. He has no rales.  Abdominal: Soft. Bowel sounds are normal. There is no rebound.  Musculoskeletal:        General: No edema.  Lymphadenopathy:    He has no cervical adenopathy.  Neurological: He is alert and oriented to person, place, and time. No cranial nerve deficit.  Skin: Skin is warm and dry.  Psychiatric: He has a normal mood and affect.  Nursing note and vitals reviewed.          Assessment & Recommendations:   68 year old Caucasian male with asymptomatic carotid artery stenosis, h/o near syncope, hypertension.  Acute kidney injury: Possibly related to lisinopril. Now resolved.   Hyperkalemia: Stable around 5.4. He may benefit from nephrology evaluation for possible renal tubular acidosis.  Near syncope/lightheadedness: Episode typical of heat exhaustion and vasovagal in etiology. No recurrence since initial event in summer 2020.   Carotid artery stenosis: Asymptomatic, moderate right, mild left carotid artery stenosis. Recommend Crestor 20 mg daily, Aspirin 81 mg daily.  LDL 137, Hope to bring at least below 100.  Repeat carotid US in 12/2019.  Hypertension: Stopped lisinopril. Added metoprolol succinate 25 mg daily given his hypertension and tachycardia. Continue amlodipine 5 mg daily.   Nigel Mormon, MD [facility] [place] Pager: [phone] Office: [phone] If no answer Cell [phone]

## 2019-10-05 ENCOUNTER — Ambulatory Visit (INDEPENDENT_AMBULATORY_CARE_PROVIDER_SITE_OTHER): Payer: Medicare HMO | Admitting: Cardiology

## 2019-10-05 ENCOUNTER — Encounter: Payer: Self-pay | Admitting: Cardiology

## 2019-10-05 ENCOUNTER — Other Ambulatory Visit: Payer: Self-pay

## 2019-10-05 VITALS — BP 417/91 | HR 97 | Temp 97.3°F | Ht 69.0 in | Wt 142.3 lb

## 2019-10-05 DIAGNOSIS — I6523 Occlusion and stenosis of bilateral carotid arteries: Secondary | ICD-10-CM

## 2019-10-05 DIAGNOSIS — I10 Essential (primary) hypertension: Secondary | ICD-10-CM | POA: Diagnosis not present

## 2019-10-05 DIAGNOSIS — E875 Hyperkalemia: Secondary | ICD-10-CM

## 2019-10-05 DIAGNOSIS — E782 Mixed hyperlipidemia: Secondary | ICD-10-CM | POA: Diagnosis not present

## 2019-10-05 MED ORDER — METOPROLOL SUCCINATE ER 25 MG PO TB24: 25.0000 mg | ORAL_TABLET | Freq: Every day | ORAL | 2 refills | Status: DC

## 2019-10-12 ENCOUNTER — Other Ambulatory Visit: Payer: Self-pay

## 2019-10-12 ENCOUNTER — Ambulatory Visit (INDEPENDENT_AMBULATORY_CARE_PROVIDER_SITE_OTHER): Payer: Medicare HMO | Admitting: Cardiology

## 2019-10-12 ENCOUNTER — Encounter: Payer: Self-pay | Admitting: Cardiology

## 2019-10-12 ENCOUNTER — Telehealth: Payer: Self-pay

## 2019-10-12 VITALS — Ht 69.0 in | Wt 143.6 lb

## 2019-10-12 DIAGNOSIS — I10 Essential (primary) hypertension: Secondary | ICD-10-CM

## 2019-10-12 DIAGNOSIS — I951 Orthostatic hypotension: Secondary | ICD-10-CM | POA: Diagnosis not present

## 2019-10-12 DIAGNOSIS — I6523 Occlusion and stenosis of bilateral carotid arteries: Secondary | ICD-10-CM

## 2019-10-12 MED ORDER — AMLODIPINE BESYLATE 2.5 MG PO TABS: 2.5000 mg | ORAL_TABLET | Freq: Every day | ORAL | 1 refills | Status: DC

## 2019-10-12 MED ORDER — METOPROLOL SUCCINATE ER 25 MG PO TB24: 25.0000 mg | ORAL_TABLET | Freq: Every evening | ORAL | 2 refills | Status: DC

## 2019-10-12 NOTE — Progress Notes (Signed)
Primary Physician/Referring:  Vernie Shanks, MD  Patient ID: Ian [name], male    DOB: 1951-04-23, 68 y.o.   MRN: [id number]  No chief complaint on file.  HPI:    Ian [name]  is a 68 y.o. Caucasian male with asymptomatic carotid artery stenosis, chronic dizziness and lightheadedness, has had multiple ED evaluations/outpatient evaluation for dizziness, hypertension, hyperlipidemia and chronic palpitations is being seen on a urgent basis again in the office for worsening symptoms.  He normally sees my partner Dr. Virgina Jock.  He has also been evaluated by neurology Dr. Marcial Pacas) 24-hour Holter monitor and carotid duplex and no significant abnormality except for bilateral asymptomatic carotid stenosis was noted and MRI of the brain had revealed mild chronic microvascular ischemic changes.  He had developed acute renal failure on lisinopril and hence it was discontinued with reversion to normal renal function in September 2020.  Patient made an appointment to see me for worsening dizziness, overall he had been feeling great and has not had any significant dizziness until recently metoprolol succinate was added to his present medical regimen.  States that soon after taking the medication, he starts feeling dizzy and doesn't feel well but by afternoon and he will start feeling better.  Otherwise he has no other specific complaints today.  He has not had any near syncope or syncope.  Past Medical History:  Diagnosis Date  . Hypercholesteremia    Past Surgical History:  Procedure Laterality Date  . NOSE SURGERY     Social History   Socioeconomic History  . Marital status: Married    Spouse name: Not on file  . Number of children: 0  . Years of education: 16  . Highest education level: Bachelor's degree (e.g., BA, AB, BS)  Occupational History  . Occupation: Quality control  Social Needs  . Financial resource strain: Not on file  . Food insecurity    Worry: Not on  file    Inability: Not on file  . Transportation needs    Medical: Not on file    Non-medical: Not on file  Tobacco Use  . Smoking status: Former Smoker    Packs/day: 0.50    Years: 40.00    Pack years: 20.00    Types: Cigarettes    Quit date: 2015    Years since quitting: 5.8  . Smokeless tobacco: Never Used  Substance and Sexual Activity  . Alcohol use: Yes    Frequency: Never    Comment: occ  . Drug use: No  . Sexual activity: Not on file  Lifestyle  . Physical activity    Days per week: Not on file    Minutes per session: Not on file  . Stress: Not on file  Relationships  . Social Herbalist on phone: Not on file    Gets together: Not on file    Attends religious service: Not on file    Active member of club or organization: Not on file    Attends meetings of clubs or organizations: Not on file    Relationship status: Not on file  . Intimate partner violence    Fear of current or ex partner: Not on file    Emotionally abused: Not on file    Physically abused: Not on file    Forced sexual activity: Not on file  Other Topics Concern  . Not on file  Social History Narrative   Lives at home with his wife.  Right-handed.   No daily use of caffeine.   ROS  Review of Systems  Constitution: Negative for chills, decreased appetite, malaise/fatigue and weight gain.  Cardiovascular: Negative for dyspnea on exertion, leg swelling and syncope.  Endocrine: Negative for cold intolerance.  Hematologic/Lymphatic: Does not bruise/bleed easily.  Musculoskeletal: Negative for joint swelling.  Gastrointestinal: Negative for abdominal pain, anorexia, change in bowel habit, hematochezia and melena.  Neurological: Positive for dizziness. Negative for headaches and light-headedness.  Psychiatric/Behavioral: Negative for depression and substance abuse.  All other systems reviewed and are negative.  Objective    Flowsheets     10/12/19 1:09 PM  10/12/19 1:17 PM  10/12/19  1:18 PM   Orthostatic BP  116/75  109/80  90/68   BP Location  LeftArm  LeftArm  LeftArm   Patient Position  Supine  Sitting  Standing   Cuff Size  Normal  Normal  Normal   Orthostatic Pulse  95  102  102   SpO2  96%  95%  95%   Weight  143lb9.6oz(65.1kg)     Height  5'9"(1.771m)          Physical Exam  Constitutional:  He is moderately built and well-nourished, no acute distress.  HENT:  Head: Atraumatic.  Eyes: Conjunctivae are normal.  Neck: Neck supple. No JVD present. No thyromegaly present.  Cardiovascular: Normal rate, regular rhythm, normal heart sounds and intact distal pulses. Exam reveals no gallop.  No murmur heard. Pulses:      Carotid pulses are 2+ on the right side with bruit and 2+ on the left side with bruit.      Radial pulses are 2+ on the right side and 2+ on the left side.       Femoral pulses are 2+ on the right side and 2+ on the left side.      Dorsalis pedis pulses are 1+ on the right side and 1+ on the left side.       Posterior tibial pulses are 1+ on the right side and 1+ on the left side.  No leg edema, no JVD.  Pulmonary/Chest: Effort normal and breath sounds normal.  Abdominal: Soft. Bowel sounds are normal.  Musculoskeletal: Normal range of motion.  Neurological: He is alert.  Skin: Skin is warm and dry.  Psychiatric: He has a normal mood and affect.    Laboratory examination:    Recent Labs    09/05/19 1326 09/17/19 1050 09/27/19 1052  NA 141 140 138  K 4.5 5.5* 5.4*  CL 102 101 101  CO2 21 24 23   GLUCOSE 172* 128* 130*  BUN 30* 33* 39*  CREATININE 1.35* 1.15 1.21  CALCIUM 9.7 10.0 10.0  GFRNONAA 54* 66 62  GFRAA 63 76 71   CMP Latest Ref Rng & Units 09/27/2019 09/17/2019 09/05/2019  Glucose 65 - 99 mg/dL 130(H) 128(H) 172(H)  BUN 8 - 27 mg/dL 39(H) 33(H) 30(H)  Creatinine 0.76 - 1.27 mg/dL 1.21 1.15 1.35(H)  Sodium 134 - 144 mmol/L 138 140 141  Potassium 3.5 - 5.2 mmol/L 5.4(H) 5.5(H) 4.5   Chloride 96 - 106 mmol/L 101 101 102  CO2 20 - 29 mmol/L 23 24 21   Calcium 8.6 - 10.2 mg/dL 10.0 10.0 9.7  Total Protein 6.5 - 8.1 g/dL - - -  Total Bilirubin 0.3 - 1.2 mg/dL - - -  Alkaline Phos 38 - 126 U/L - - -  AST 15 - 41 U/L - - -  ALT 0 -  44 U/L - - -   CBC Latest Ref Rng & Units 07/05/2019  WBC 4.0 - 10.5 K/uL 7.4  Hemoglobin 13.0 - 17.0 g/dL 12.7(L)  Hematocrit 39.0 - 52.0 % 39.5  Platelets 150 - 400 K/uL 262   Lipid Panel     Component Value Date/Time   CHOL 229 (H) 09/27/2019 1051   TRIG 118 09/27/2019 1051   HDL 71 09/27/2019 1051   CHOLHDL 3.2 09/27/2019 1051   LDLCALC 137 (H) 09/27/2019 1051   HEMOGLOBIN A1C No results found for: HGBA1C, MPG TSH No results for input(s): TSH in the last 8760 hours. Medications and allergies  No Known Allergies   Prior to Admission medications   Medication Sig Start Date End Date Taking? Authorizing Provider  amLODipine (NORVASC) 5 MG tablet Take 1 tablet (5 mg total) by mouth daily. 09/07/19 12/06/19  Patwardhan, Reynold Bowen, MD  co-enzyme Q-10 30 MG capsule Take 100 mg by mouth daily.    [provider]  metoprolol succinate (TOPROL XL) 25 MG 24 hr tablet Take 1 tablet (25 mg total) by mouth daily. 10/05/19   Patwardhan, Reynold Bowen, MD  Multiple Vitamins-Minerals (MULTIVITAMIN WITH MINERALS) tablet Take 1 tablet by mouth daily.    [provider]     Current Outpatient Medications  Medication Instructions  . amLODipine (NORVASC) 5 mg, Oral, Daily  . co-enzyme Q-10 100 mg, Oral, Daily  . metoprolol succinate (TOPROL XL) 25 mg, Oral, Daily  . Multiple Vitamins-Minerals (MULTIVITAMIN WITH MINERALS) tablet 1 tablet, Oral, Daily    Radiology:  No results found.  Cardiac Studies:   Carotid artery duplex January 06, 2019: Minimal stenosis in the right internal carotid artery (1-15%).  Stenosis in the right common carotid artery (<50%). Stenosis in the right external carotid artery (<50%). Minimal stenosis in the  left internal carotid artery (1-15%). Stenosis in the left common carotid artery (<50%). Stenosis in the left external carotid artery (<50%). Antegrade right vertebral artery flow. Antegrade left vertebral artery flow. Compared to the study done on 08/02/2018, right ICA velocity is decreased and stenosis range of greater than 50% is now less than 50% throughout.  Significant improvement in plaque burden. Follow up in one year is appropriate if clinically indicated.  Hospital echocardiogram 11/10/2017:  - Left ventricle: mild inferior wall hypokinesis. The cavity size   was normal. Systolic function was normal. The estimated ejection   fraction was in the range of 50% to 55%. Wall motion was normal;   there were no regional wall motion abnormalities. Left  ventricular diastolic function parameters were normal. - Left atrium: The atrium was mildly dilated. - Right ventricle: The cavity size was mildly dilated. Wall   thickness was normal. - Right atrium: The atrium was mildly dilated. - Atrial septum: No defect or patent foramen ovale was identified.   Assessment     ICD-10-CM   1. Essential hypertension  I10   2. Bilateral carotid artery stenosis  I65.23   3. Near syncope  R55   4. Tachycardia  R00.0     EKG 09/07/2019: Sinus rhythm 96 bpm. Incomplete right bundle branch block Left anterior fascicular block.  Left atrial enlargement.  Compared to previous EKG on 08/20/2019, rate is slower.   Recommendations:   Patient evaluated for dizziness, ongoing for several months, had been doing well and has had extensive evaluation and multiple ED/outpatient visits.  He has asymptomatic bilateral carotid stenosis.  Patient's symptoms of dizziness are clearly related to orthostatic hypotension.  Urine when  he was initially evaluated he was mildly orthostatic but with the addition of metoprolol succinate on top of his evaluate her amlodipine, he is now severely orthostatic and having low blood  pressure leading to dizziness.  Do not suspect vertebral steal syndrome.  Advised him to reduce the amlodipine from 5 mg to 2.5 mg to be taken in the morning and change in taking metoprolol succinate from morning to be taken in the evening only.  Keep all the appointments as before, otherwise no changes were done today.  Adrian Prows, MD, [facility] 10/12/2019, 12:53 PM [facility] [place] Pager: [phone] Office: [phone] If no answer Cell [phone]

## 2019-10-12 NOTE — Telephone Encounter (Signed)
Telephone encounter:  Reason for call: Patient dizzy, Almost passed out  Usual provider: Patwardhan  Last office visit: 10/05/19  Next office visit: 01/07/20   Last hospitalization:    Current Outpatient Medications on File Prior to Visit  Medication Sig Dispense Refill  . amLODipine (NORVASC) 5 MG tablet Take 1 tablet (5 mg total) by mouth daily. 30 tablet 3  . co-enzyme Q-10 30 MG capsule Take 100 mg by mouth daily.    . metoprolol succinate (TOPROL XL) 25 MG 24 hr tablet Take 1 tablet (25 mg total) by mouth daily. 90 tablet 2  . Multiple Vitamins-Minerals (MULTIVITAMIN WITH MINERALS) tablet Take 1 tablet by mouth daily.     No current facility-administered medications on file prior to visit.

## 2019-11-22 ENCOUNTER — Ambulatory Visit: Payer: Medicare HMO | Admitting: Cardiology

## 2019-11-22 ENCOUNTER — Other Ambulatory Visit: Payer: Self-pay | Admitting: Cardiology

## 2019-11-22 DIAGNOSIS — I10 Essential (primary) hypertension: Secondary | ICD-10-CM

## 2019-12-03 ENCOUNTER — Other Ambulatory Visit: Payer: Self-pay | Admitting: Cardiology

## 2019-12-03 DIAGNOSIS — I10 Essential (primary) hypertension: Secondary | ICD-10-CM

## 2020-01-03 ENCOUNTER — Other Ambulatory Visit: Payer: Self-pay

## 2020-01-03 ENCOUNTER — Ambulatory Visit (INDEPENDENT_AMBULATORY_CARE_PROVIDER_SITE_OTHER): Payer: Medicare HMO

## 2020-01-03 DIAGNOSIS — I6521 Occlusion and stenosis of right carotid artery: Secondary | ICD-10-CM | POA: Diagnosis not present

## 2020-01-06 ENCOUNTER — Other Ambulatory Visit: Payer: Self-pay | Admitting: Cardiology

## 2020-01-06 DIAGNOSIS — I6523 Occlusion and stenosis of bilateral carotid arteries: Secondary | ICD-10-CM

## 2020-01-07 ENCOUNTER — Telehealth: Payer: Medicare HMO | Admitting: Cardiology

## 2020-01-07 ENCOUNTER — Ambulatory Visit: Payer: Medicare HMO | Admitting: Cardiology

## 2020-01-07 NOTE — Progress Notes (Signed)
I think you are seeing him in 2 days

## 2020-01-08 DIAGNOSIS — L570 Actinic keratosis: Secondary | ICD-10-CM | POA: Diagnosis not present

## 2020-01-08 DIAGNOSIS — C44229 Squamous cell carcinoma of skin of left ear and external auricular canal: Secondary | ICD-10-CM | POA: Diagnosis not present

## 2020-01-08 DIAGNOSIS — D485 Neoplasm of uncertain behavior of skin: Secondary | ICD-10-CM | POA: Diagnosis not present

## 2020-01-09 ENCOUNTER — Telehealth (INDEPENDENT_AMBULATORY_CARE_PROVIDER_SITE_OTHER): Payer: Medicare HMO | Admitting: Cardiology

## 2020-01-09 ENCOUNTER — Encounter: Payer: Self-pay | Admitting: Cardiology

## 2020-01-09 DIAGNOSIS — I10 Essential (primary) hypertension: Secondary | ICD-10-CM | POA: Diagnosis not present

## 2020-01-09 DIAGNOSIS — E782 Mixed hyperlipidemia: Secondary | ICD-10-CM | POA: Diagnosis not present

## 2020-01-09 DIAGNOSIS — I6523 Occlusion and stenosis of bilateral carotid arteries: Secondary | ICD-10-CM | POA: Diagnosis not present

## 2020-01-09 MED ORDER — EZETIMIBE 10 MG PO TABS: 10.0000 mg | ORAL_TABLET | Freq: Every day | ORAL | 5 refills | Status: DC

## 2020-01-09 MED ORDER — ASPIRIN EC 81 MG PO TBEC: 81.0000 mg | DELAYED_RELEASE_TABLET | Freq: Every day | ORAL | 3 refills | Status: DC

## 2020-01-09 NOTE — Progress Notes (Signed)
Follow up visit  Subjective:   Ian [name], male    DOB: [birth year], 69 y.o.   MRN: [id number]  I connected with the patient on 01/09/20 by a telephone call and verified that I am speaking with the correct person using two identifiers.     I offered the patient a video enabled application for a virtual visit. Unfortunately, this could not be accomplished due to technical difficulties/lack of video enabled phone/computer. I discussed the limitations of evaluation and management by telemedicine and the availability of in person appointments. The patient expressed understanding and agreed to proceed.   This visit type was conducted due to national recommendations for restrictions regarding the COVID-19 Pandemic (e.g. social distancing).  This format is felt to be most appropriate for this patient at this time.  All issues noted in this document were discussed and addressed.  No physical exam was performed (except for noted visual exam findings with Tele health visits).  The patient has consented to conduct a Tele health visit and understands insurance will be billed.    HPI  Chief Complaint  Patient presents with  . Hypertension  . Follow-up    3 month    69 year old Caucasian male with asymptomatic carotid artery stenosis, h/o near syncope, hypertension.  Lightheadedness is improved after reducing amlodipine down to 2.5 mg daily.  He is currently not taking aspirin or statin.  Recent carotid artery ultrasound results discussed with the patient.  He has used Crestor and Lipitor in the past and has not tolerated due to side effects, including elevated A1c.   Current Outpatient Medications on File Prior to Visit  Medication Sig Dispense Refill  . amLODipine (NORVASC) 2.5 MG tablet Take 1 tablet (2.5 mg total) by mouth daily. 90 tablet 1  . co-enzyme Q-10 30 MG capsule Take by mouth daily.     . Multiple Vitamins-Minerals (MULTIVITAMIN WITH MINERALS) tablet Take 1 tablet by mouth  daily.     No current facility-administered medications on file prior to visit.    Cardiovascular & other pertient studies:  Carotid artery duplex 01/03/2020:  Stenosis in the right internal carotid artery (50-69%).  Stenosis in the left common carotid artery (<50%).  Antegrade right vertebral artery flow. Antegrade left vertebral artery  flow.  Follow up in six months is appropriate if clinically indicated.  Compared to 01/02/2019, progressing of disease, but compared to prior  carotid duplex 08/02/2018, no change. recheck in 6 months.   EKG 09/07/2019: Sinus rhythm 96 bpm. Incomplete right bundle branch block Left anterior fascicular block.  Left atrial enlargement.  COmpared to previous EKG on 08/20/2019, rate is slower.   Carotid artery duplex 01/02/2019: Minimal stenosis in the right internal carotid artery (1-15%). Stenosis in the right common carotid artery (<50%). Stenosis in the right external carotid artery (<50%). Minimal stenosis in the left internal carotid artery (1-15%). Stenosis in the left common carotid artery (<50%). Stenosis in the left external carotid artery (<50%). Antegrade right vertebral artery flow. Antegrade left vertebral artery flow. Compared to the study done on 08/02/2018, right ICA velocity is decreased and stenosis range of greater than 50% is now less than 50% throughout. Significant improvement in plaque burden. Follow up in one year is appropriate if clinically indicated.  Hospital echocardiogram 11/10/2017:  - Left ventricle: mild inferior wall hypokinesis. The cavity size  was normal. Systolic function was normal. The estimated ejection  fraction was in the range of 50% to 55%. Wall motion was normal;  there were no regional wall motion abnormalities. Left  ventricular diastolic function parameters were normal. - Left atrium: The atrium was mildly dilated. - Right ventricle: The cavity size was mildly dilated. Wall  thickness was  normal. - Right atrium: The atrium was mildly dilated. - Atrial septum: No defect or patent foramen ovale was identified.  Recent labs: 09/27/2019: Glucose 130, BUN/Cr 39/1.21. EGFR 62. Na/K 138/5.4.  H/H 12.7/39.5. MCV 94. Platelets 262 HbA1C ?6% Chol 229, TG 118, HDL 71, LDL 137  Review of Systems  Cardiovascular: Negative for chest pain, dyspnea on exertion, leg swelling, palpitations and syncope.         Vitals not available  Objective:   Physical Exam Not performed.  Telephone visit.       Assessment & Recommendations:   69 year old Caucasian male with asymptomatic carotid artery stenosis, h/o near syncope, hypertension.  Near syncope/lightheadedness: No recurrence of symptoms after resting.  Carotid artery stenosis: Asymptomatic, moderate right, mild left carotid artery stenosis.  Recommend aspirin 81 mg daily.   He has had side effects, including elevated A1c with Lipitor and Crestor.  Will try Zetia 10 mg daily.   He will get repeat lipid panel with his PCP in the summer.    Hypertension: Well-controlled.  Follow-up in 6 months.   Nigel Mormon, MD [facility] Pager: [phone] Office: [phone]

## 2020-01-10 ENCOUNTER — Telehealth: Payer: Self-pay

## 2020-01-10 MED ORDER — ROSUVASTATIN CALCIUM 5 MG PO TABS: 5.0000 mg | ORAL_TABLET | ORAL | 2 refills | Status: DC

## 2020-01-10 NOTE — Telephone Encounter (Signed)
Ok. Please stop Zetia. Send Crestor 5 mg prescription (as directed). Mention in comments, patient would like to take it twice a week. Send 60 pills X 2 refills.  Thanks MJP

## 2020-01-10 NOTE — Telephone Encounter (Signed)
Patient wife called and the patient wants to take a statin 2 times a week instead of [phone].    Please advise.

## 2020-01-23 DIAGNOSIS — Z85828 Personal history of other malignant neoplasm of skin: Secondary | ICD-10-CM | POA: Diagnosis not present

## 2020-01-23 DIAGNOSIS — C44229 Squamous cell carcinoma of skin of left ear and external auricular canal: Secondary | ICD-10-CM | POA: Diagnosis not present

## 2020-02-07 ENCOUNTER — Ambulatory Visit: Payer: Medicare HMO | Admitting: Cardiology

## 2020-02-08 ENCOUNTER — Ambulatory Visit: Payer: Medicare HMO | Attending: Internal Medicine

## 2020-02-08 DIAGNOSIS — Z23 Encounter for immunization: Secondary | ICD-10-CM | POA: Insufficient documentation

## 2020-02-08 NOTE — Progress Notes (Signed)
   Covid-19 Vaccination Clinic  Name:  LOYLE LEVECK    MRN: [id number] DOB: April 04, 1951  02/08/2020  Mr. Selfe was observed post Covid-19 immunization for 15 minutes without incident. He was provided with Vaccine Information Sheet and instruction to access the V-Safe system.   Mr. Mullally was instructed to call 911 with any severe reactions post vaccine: Marland Kitchen Difficulty breathing  . Swelling of face and throat  . A fast heartbeat  . A bad rash all over body  . Dizziness and weakness

## 2020-02-25 ENCOUNTER — Telehealth: Payer: Self-pay | Admitting: Emergency Medicine

## 2020-02-25 NOTE — Telephone Encounter (Signed)
Pt is requesting to est care with you. Are you ok with this?

## 2020-02-29 NOTE — Telephone Encounter (Signed)
Wife called back and states that they do not need this appointment at this time.

## 2020-02-29 NOTE — Telephone Encounter (Signed)
Called patient and LVM to call back and reschedule new pt appt with Dr Ronnald Ramp.

## 2020-03-05 ENCOUNTER — Ambulatory Visit: Payer: Medicare HMO | Attending: Internal Medicine

## 2020-03-05 DIAGNOSIS — Z23 Encounter for immunization: Secondary | ICD-10-CM

## 2020-03-05 NOTE — Progress Notes (Signed)
   Covid-19 Vaccination Clinic  Name:  Ian [name]    MRN: [id number] DOB: 03-02-1951  03/05/2020  Mr. Gilford was observed post Covid-19 immunization for 15 minutes without incident. He was provided with Vaccine Information Sheet and instruction to access the V-Safe system.   Mr. Markoski was instructed to call 911 with any severe reactions post vaccine: Marland Kitchen Difficulty breathing  . Swelling of face and throat  . A fast heartbeat  . A bad rash all over body  . Dizziness and weakness   Immunizations Administered    Name Date Dose VIS Date Route   Pfizer COVID-19 Vaccine 03/05/2020 12:54 PM 0.3 mL 11/16/2019 Intramuscular   Manufacturer: [place]   Lot: [id number]   [place]: [id number]

## 2020-03-17 DIAGNOSIS — D631 Anemia in chronic kidney disease: Secondary | ICD-10-CM | POA: Diagnosis not present

## 2020-03-17 DIAGNOSIS — I2510 Atherosclerotic heart disease of native coronary artery without angina pectoris: Secondary | ICD-10-CM | POA: Diagnosis not present

## 2020-03-17 DIAGNOSIS — I129 Hypertensive chronic kidney disease with stage 1 through stage 4 chronic kidney disease, or unspecified chronic kidney disease: Secondary | ICD-10-CM | POA: Diagnosis not present

## 2020-03-17 DIAGNOSIS — N189 Chronic kidney disease, unspecified: Secondary | ICD-10-CM | POA: Diagnosis not present

## 2020-03-17 DIAGNOSIS — N2581 Secondary hyperparathyroidism of renal origin: Secondary | ICD-10-CM | POA: Diagnosis not present

## 2020-03-17 DIAGNOSIS — E785 Hyperlipidemia, unspecified: Secondary | ICD-10-CM | POA: Diagnosis not present

## 2020-03-17 DIAGNOSIS — N183 Chronic kidney disease, stage 3 unspecified: Secondary | ICD-10-CM | POA: Diagnosis not present

## 2020-03-24 ENCOUNTER — Other Ambulatory Visit: Payer: Self-pay | Admitting: Nephrology

## 2020-03-24 DIAGNOSIS — N2581 Secondary hyperparathyroidism of renal origin: Secondary | ICD-10-CM

## 2020-03-24 DIAGNOSIS — I129 Hypertensive chronic kidney disease with stage 1 through stage 4 chronic kidney disease, or unspecified chronic kidney disease: Secondary | ICD-10-CM

## 2020-03-24 DIAGNOSIS — D631 Anemia in chronic kidney disease: Secondary | ICD-10-CM

## 2020-03-24 DIAGNOSIS — N183 Chronic kidney disease, stage 3 unspecified: Secondary | ICD-10-CM

## 2020-03-24 DIAGNOSIS — N189 Chronic kidney disease, unspecified: Secondary | ICD-10-CM

## 2020-03-26 ENCOUNTER — Ambulatory Visit
Admission: RE | Admit: 2020-03-26 | Discharge: 2020-03-26 | Disposition: A | Discharge status: Home / Self Care | Payer: Medicare HMO | Source: Ambulatory Visit | Attending: Nephrology | Admitting: Nephrology

## 2020-03-26 DIAGNOSIS — N2581 Secondary hyperparathyroidism of renal origin: Secondary | ICD-10-CM

## 2020-03-26 DIAGNOSIS — D631 Anemia in chronic kidney disease: Secondary | ICD-10-CM

## 2020-03-26 DIAGNOSIS — N183 Chronic kidney disease, stage 3 unspecified: Secondary | ICD-10-CM | POA: Diagnosis not present

## 2020-03-26 DIAGNOSIS — I129 Hypertensive chronic kidney disease with stage 1 through stage 4 chronic kidney disease, or unspecified chronic kidney disease: Secondary | ICD-10-CM

## 2020-03-26 DIAGNOSIS — N189 Chronic kidney disease, unspecified: Secondary | ICD-10-CM

## 2020-04-11 ENCOUNTER — Other Ambulatory Visit: Payer: Self-pay | Admitting: Cardiology

## 2020-04-11 DIAGNOSIS — I10 Essential (primary) hypertension: Secondary | ICD-10-CM

## 2020-04-21 ENCOUNTER — Other Ambulatory Visit: Payer: Self-pay | Admitting: *Deleted

## 2020-04-21 DIAGNOSIS — I722 Aneurysm of renal artery: Secondary | ICD-10-CM

## 2020-04-24 ENCOUNTER — Other Ambulatory Visit: Payer: Self-pay

## 2020-04-24 ENCOUNTER — Encounter: Payer: Self-pay | Admitting: Vascular Surgery

## 2020-04-24 ENCOUNTER — Ambulatory Visit (HOSPITAL_COMMUNITY)
Admission: RE | Admit: 2020-04-24 | Discharge: 2020-04-24 | Disposition: A | Discharge status: Home / Self Care | Payer: Medicare HMO | Source: Ambulatory Visit | Attending: Vascular Surgery | Admitting: Vascular Surgery

## 2020-04-24 ENCOUNTER — Ambulatory Visit (INDEPENDENT_AMBULATORY_CARE_PROVIDER_SITE_OTHER): Payer: Medicare HMO | Admitting: Vascular Surgery

## 2020-04-24 ENCOUNTER — Ambulatory Visit (INDEPENDENT_AMBULATORY_CARE_PROVIDER_SITE_OTHER)
Admission: RE | Admit: 2020-04-24 | Discharge: 2020-04-24 | Disposition: A | Discharge status: Home / Self Care | Payer: Medicare HMO | Source: Ambulatory Visit | Attending: Vascular Surgery | Admitting: Vascular Surgery

## 2020-04-24 VITALS — BP 135/88 | HR 84 | Temp 97.8°F | Resp 20 | Ht 69.0 in | Wt 151.0 lb

## 2020-04-24 DIAGNOSIS — I722 Aneurysm of renal artery: Secondary | ICD-10-CM | POA: Diagnosis not present

## 2020-04-24 DIAGNOSIS — I714 Abdominal aortic aneurysm, without rupture, unspecified: Secondary | ICD-10-CM

## 2020-04-24 NOTE — Progress Notes (Signed)
Referring Physician: Dr. Justin Mend  Patient name: Ian [name] MRN: [id number] DOB: 12-06-1951 Sex: male  REASON FOR CONSULT: abdominal aortic aneurysm  HPI: Ian [name] is a 69 y.o. male,  Who was recently found on renal ultrasound to evaluate his kidney function that he had a 5.3 x 4.4 cm abdominal aortic aneurysm.  Has a family history of a brain aneurysm in his father but no history of abdominal aortic aneurysms.  He has not had any significant abdominal or back pain.  He is followed by Dr. Edrick Oh for mild renal dysfunction.  His recent serum creatinine was 1.2.  He is a former smoker but quit in 2015.   He also has a history of carotid artery occlusive disease with a 50 to 70% stenosis of the right internal carotid artery on ultrasound in 2018.  He had a follow-up ultrasound by Dr. Virgina Jock January 03, 2020 which was essentially unchanged.  He was scheduled to have a follow-up of this in 6 months.  Other medical problems include hypertension elevated cholesterol which are both currently controlled.  Past Medical History:  Diagnosis Date  . Chronic kidney disease   . Hypercholesteremia    Past Surgical History:  Procedure Laterality Date  . NOSE SURGERY      Family History  Problem Relation Age of Onset  . Colon cancer Mother   . Bladder Cancer Father     SOCIAL HISTORY: Social History   Socioeconomic History  . Marital status: Married    Spouse name: Not on file  . Number of children: 0  . Years of education: 16  . Highest education level: Bachelor's degree (e.g., BA, AB, BS)  Occupational History  . Occupation: Quality control  Tobacco Use  . Smoking status: Former Smoker    Packs/day: 0.50    Years: 40.00    Pack years: 20.00    Types: Cigarettes    Quit date: 2015    Years since quitting: 6.3  . Smokeless tobacco: Never Used  Substance and Sexual Activity  . Alcohol use: Yes    Comment: occ  . Drug use: No  . Sexual activity: Not on  file  Other Topics Concern  . Not on file  Social History Narrative   Lives at home with his wife.   Right-handed.   No daily use of caffeine.   Social Determinants of Health   Financial Resource Strain:   . Difficulty of Paying Living Expenses:   Food Insecurity:   . Worried About Charity fundraiser in the Last Year:   . Arboriculturist in the Last Year:   Transportation Needs:   . Film/video editor (Medical):   Marland Kitchen Lack of Transportation (Non-Medical):   Physical Activity:   . Days of Exercise per Week:   . Minutes of Exercise per Session:   Stress:   . Feeling of Stress :   Social Connections:   . Frequency of Communication with Friends and Family:   . Frequency of Social Gatherings with Friends and Family:   . Attends Religious Services:   . Active Member of Clubs or Organizations:   . Attends Archivist Meetings:   Marland Kitchen Marital Status:   Intimate Partner Violence:   . Fear of Current or Ex-Partner:   . Emotionally Abused:   Marland Kitchen Physically Abused:   . Sexually Abused:     No Known Allergies  Current Outpatient Medications  Medication Sig Dispense Refill  .  amLODipine (NORVASC) 2.5 MG tablet TAKE 1 TABLET BY MOUTH EVERY DAY 90 tablet 1  . Multiple Vitamins-Minerals (MULTIVITAMIN WITH MINERALS) tablet Take 1 tablet by mouth daily.    . rosuvastatin (CRESTOR) 5 MG tablet Take 1 tablet (5 mg total) by mouth 2 (two) times a week. 60 tablet 2   No current facility-administered medications for this visit.    ROS:   General:  No weight loss, Fever, chills  HEENT: No recent headaches, no nasal bleeding, no visual changes, no sore throat  Neurologic: No dizziness, blackouts, seizures. No recent symptoms of stroke or mini- stroke. No recent episodes of slurred speech, or temporary blindness.  Cardiac: No recent episodes of chest pain/pressure, no shortness of breath at rest.  No shortness of breath with exertion.  Denies history of atrial fibrillation or  irregular heartbeat  Vascular: No history of rest pain in feet.  No history of claudication.  No history of non-healing ulcer, No history of DVT   Pulmonary: No home oxygen, no productive cough, no hemoptysis,  No asthma or wheezing  Musculoskeletal:  [ ]  Arthritis, [ ]  Low back pain,  [ ]  Joint pain  Hematologic:No history of hypercoagulable state.  No history of easy bleeding.  No history of anemia  Gastrointestinal: No hematochezia or melena,  No gastroesophageal reflux, no trouble swallowing  Urinary: [X]  chronic Kidney disease, [ ]  on HD - [ ]  MWF or [ ]  TTHS, [ ]  Burning with urination, [ ]  Frequent urination, [ ]  Difficulty urinating;   Skin: No rashes  Psychological: No history of anxiety,  No history of depression   Physical Examination  Vitals:   04/24/20 0839  BP: 135/88  Pulse: 84  Resp: 20  Temp: 97.8 F (36.6 C)  SpO2: 95%  Weight: 151 lb (68.5 kg)  Height: 5\' 9"  (1.753 m)    Body mass index is 22.3 kg/m.  General:  Alert and oriented, no acute distress HEENT: Normal Neck: No JVD Pulmonary: Clear to auscultation bilaterally Cardiac: Regular Rate and Rhythm  Abdomen: Soft, non-tender, non-distended, pulsatile epigastric mass consistent with 4 to 5 cm aneurysm Skin: No rash Extremity Pulses:  2+ radial, brachial, femoral, dorsalis pedis, 2+ right absent left posterior tibial pulses bilaterally Musculoskeletal: No deformity or edema  Neurologic: Upper and lower extremity motor 5/5 and symmetric  DATA:  The studies were done based on a request from Dr. Jason Nest office for possible hemodialysis access apparently this was an error on the referral process.  he had an upper extremity arterial duplex exam today which was normal.  He had triphasic flow bilaterally.  Artery was normal anatomic configuration.  He also had bilateral upper extremity vein mapping which showed the cephalic vein was slightly better quality on the right side with a 2.8 mm vein at the  wrist and greater than 3 mm in the upper arm.  The left cephalic vein was small at the left wrist but the upper arm cephalic was greater than 3 mm.  Basilic vein was 3 mm bilaterally.  Reviewed and interpreted both the studies.  I also reviewed the patient's recent renal ultrasound which shows a 5.3 cm abdominal aortic aneurysm  ASSESSMENT:   1.  Abdominal aortic aneurysm we will schedule for noncontrast CT scan of the abdomen and pelvis to further clarify aortic ultrasound findings  2.  Moderate right carotid stenosis followed by Dr. Virgina Jock.  Consider intervention if greater than 80% and asymptomatic or greater than 70% with symptoms.  Currently  asymptomatic.   PLAN: Patient will be scheduled for a virtual visit after his CT scan chest abdomen and pelvis to further clarify his aneurysm.  I had a lengthy discussion with the patient and his wife today regarding aneurysm treatment and consideration for repair greater than 5-1/2 cm diameter.  Also discussed with him the risk of aneurysm rupture of aneurysm less than his diameter is fairly low.   Ruta Hinds, MD [facility] Office: [phone] Pager: [phone]

## 2020-05-06 ENCOUNTER — Other Ambulatory Visit: Payer: Self-pay | Admitting: Vascular Surgery

## 2020-05-06 DIAGNOSIS — I714 Abdominal aortic aneurysm, without rupture, unspecified: Secondary | ICD-10-CM

## 2020-05-07 ENCOUNTER — Ambulatory Visit
Admission: RE | Admit: 2020-05-07 | Discharge: 2020-05-07 | Disposition: A | Discharge status: Home / Self Care | Payer: Medicare HMO | Source: Ambulatory Visit | Attending: Vascular Surgery | Admitting: Vascular Surgery

## 2020-05-07 DIAGNOSIS — R911 Solitary pulmonary nodule: Secondary | ICD-10-CM | POA: Diagnosis not present

## 2020-05-07 DIAGNOSIS — J439 Emphysema, unspecified: Secondary | ICD-10-CM | POA: Diagnosis not present

## 2020-05-07 DIAGNOSIS — I714 Abdominal aortic aneurysm, without rupture, unspecified: Secondary | ICD-10-CM

## 2020-05-07 DIAGNOSIS — D3501 Benign neoplasm of right adrenal gland: Secondary | ICD-10-CM | POA: Diagnosis not present

## 2020-05-15 ENCOUNTER — Other Ambulatory Visit: Payer: Self-pay

## 2020-05-15 ENCOUNTER — Ambulatory Visit (INDEPENDENT_AMBULATORY_CARE_PROVIDER_SITE_OTHER): Payer: Medicare HMO | Admitting: Vascular Surgery

## 2020-05-15 VITALS — BP 102/72

## 2020-05-15 DIAGNOSIS — I716 Thoracoabdominal aortic aneurysm, without rupture, unspecified: Secondary | ICD-10-CM

## 2020-05-15 NOTE — Progress Notes (Signed)
° ° °  Virtual Visit via Telephone Note  I connected with Ian [name] on 05/15/2020 using the telephone and verified that I was speaking with the correct person using two identifiers. Patient was located at home and accompanied by his wife. I am located at our office on [address].   The limitations of evaluation and management by telemedicine and the availability of in person appointments have been previously discussed with the patient and are documented in the patients chart. The patient expressed understanding and consented to proceed.  PCP: Vernie Shanks, MD  History of Present Illness: Ian [name] is a 69 y.o. male who returns for follow-up today after recent CT angiogram of the chest abdomen and pelvis for evaluation of abdominal aortic aneurysm.  The study was done without contrast due to prior history of renal dysfunction.  He was originally referred by Dr. Justin Mend from nephrology.  Patient continues to have no significant abdominal or back pain.  Past Medical History:  Diagnosis Date   Chronic kidney disease    Hypercholesteremia     Past Surgical History:  Procedure Laterality Date   NOSE SURGERY      Current Meds  Medication Sig   amLODipine (NORVASC) 2.5 MG tablet TAKE 1 TABLET BY MOUTH EVERY DAY   Multiple Vitamins-Minerals (MULTIVITAMIN WITH MINERALS) tablet Take 1 tablet by mouth daily.    He has no chest pain or shortness of breath.   Observations/Objective: I reviewed all of the images of his CT scan of the chest abdomen and pelvis today.  Again this was done without contrast.  The ascending aorta was 3.7 cm in diameter.  The infrarenal portion of the abdominal aorta was 4.5 cm in diameter.  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  The aorta is 3.4 cm at the level of the SMA.  Incidental findings were a left lung nodule and an adrenal adenoma.  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  I discussed with the patient and his wife today the pathophysiology of aneurysms the natural history of aneurysm growth over time and the risk of rupture for a 4.5 cm aneurysm.  We also discussed that if the aneurysm reaches 5-1/2 to 6 cm in diameter we would consider repair at that point but due to the fact that it is a thoracoabdominal aneurysm this will be a more complex repair and procedure and I would go into more details of that if we have reached that diameter.  This potentially would require open repair but with changes in stent graft technology and may be stented but at some point in the future as well.  We also discussed the importance of maintaining his blood pressure systolic less than 944 and preferably less than 120 if possible.  Follow Up Instructions:  Patient will have a follow-up noncontrast CT scan of the chest abdomen and pelvis and see me in 6 months.  He will inform the emergency room if he has any severe chest or abdominal or back pain that he has a history of aneurysm. I spent 10 minutes with the patient via telephone encounter.   Signed, Ruta Hinds [facility] Office: [phone]  05/15/2020, 3:27 PM

## 2020-06-19 ENCOUNTER — Other Ambulatory Visit: Payer: Self-pay

## 2020-06-19 ENCOUNTER — Ambulatory Visit: Payer: Medicare HMO

## 2020-06-19 DIAGNOSIS — I6523 Occlusion and stenosis of bilateral carotid arteries: Secondary | ICD-10-CM

## 2020-06-22 ENCOUNTER — Other Ambulatory Visit: Payer: Self-pay | Admitting: Cardiology

## 2020-06-22 DIAGNOSIS — I6523 Occlusion and stenosis of bilateral carotid arteries: Secondary | ICD-10-CM

## 2020-06-23 NOTE — Telephone Encounter (Signed)
Blood pressure and heart rate control will be important. I notice you have a telephone appt with me in August. Would you be okay if I see you in person instead? I will discuss more then.  If yes, staff, please arrange in office follow up.  Regards, Dr. Virgina Jock

## 2020-06-23 NOTE — Telephone Encounter (Signed)
From patient.

## 2020-06-25 ENCOUNTER — Other Ambulatory Visit: Payer: Self-pay

## 2020-06-25 DIAGNOSIS — I10 Essential (primary) hypertension: Secondary | ICD-10-CM

## 2020-06-25 MED ORDER — AMLODIPINE BESYLATE 2.5 MG PO TABS: 2.5000 mg | ORAL_TABLET | Freq: Every day | ORAL | 1 refills | Status: DC

## 2020-06-25 MED ORDER — ROSUVASTATIN CALCIUM 5 MG PO TABS: 5.0000 mg | ORAL_TABLET | ORAL | 2 refills | Status: DC

## 2020-07-04 ENCOUNTER — Other Ambulatory Visit: Payer: Medicare HMO

## 2020-07-11 ENCOUNTER — Other Ambulatory Visit: Payer: Self-pay

## 2020-07-11 ENCOUNTER — Ambulatory Visit: Payer: Medicare HMO | Admitting: Cardiology

## 2020-07-11 ENCOUNTER — Encounter: Payer: Self-pay | Admitting: Cardiology

## 2020-07-11 VITALS — BP 135/81 | HR 77 | Resp 17 | Ht 69.0 in | Wt 150.0 lb

## 2020-07-11 DIAGNOSIS — I714 Abdominal aortic aneurysm, without rupture, unspecified: Secondary | ICD-10-CM | POA: Insufficient documentation

## 2020-07-11 DIAGNOSIS — I10 Essential (primary) hypertension: Secondary | ICD-10-CM | POA: Diagnosis not present

## 2020-07-11 DIAGNOSIS — E782 Mixed hyperlipidemia: Secondary | ICD-10-CM | POA: Diagnosis not present

## 2020-07-11 DIAGNOSIS — I6523 Occlusion and stenosis of bilateral carotid arteries: Secondary | ICD-10-CM

## 2020-07-11 MED ORDER — LOSARTAN POTASSIUM 50 MG PO TABS: 50.0000 mg | ORAL_TABLET | Freq: Every evening | ORAL | 2 refills | Status: DC

## 2020-07-11 MED ORDER — ASPIRIN EC 81 MG PO TBEC: 81.0000 mg | DELAYED_RELEASE_TABLET | Freq: Every day | ORAL | 3 refills | Status: DC

## 2020-07-11 NOTE — Progress Notes (Addendum)
Follow up visit. Patient seen by me Dr. Adrian Prows in the absence of Dr. Virgina Jock.   Subjective:   Ian [name], male    DOB: 1951/01/17, 69 y.o.   MRN: [id number]   HPI  Chief Complaint  Patient presents with  . Hypertension  . Hyperlipidemia  . Follow-up    6 month  . AAA    69 year old Caucasian male with asymptomatic mild carotid artery stenosis, hypertension, hyperlipidemia, history of tobacco use disorder which he says he quit few months ago, a 4.5 cm AAA.  He presents for routine follow-up to discuss the results of the CT abdomen and follow-up of hyperlipidemia and hypertension along with carotid atherosclerosis.  This is a 11-monthoffice visit.  He is accompanied by his wife.  No other specific complaints, no chest pain, no shortness of breath.  His activity is limited due to arthritis in his left knee.  Current Outpatient Medications on File Prior to Visit  Medication Sig Dispense Refill  . amLODipine (NORVASC) 5 MG tablet Take 5 mg by mouth daily.    . Coenzyme Q10 (CO Q10) 100 MG CAPS Take 1 capsule by mouth daily.    . Multiple Vitamins-Minerals (MULTIVITAMIN WITH MINERALS) tablet Take 1 tablet by mouth daily.    . rosuvastatin (CRESTOR) 5 MG tablet Take 1 tablet (5 mg total) by mouth 2 (two) times a week. 60 tablet 2   No current facility-administered medications on file prior to visit.      Outpatient Medications Prior to Visit  Medication Sig Dispense Refill  . amLODipine (NORVASC) 5 MG tablet Take 5 mg by mouth daily.    . Coenzyme Q10 (CO Q10) 100 MG CAPS Take 1 capsule by mouth daily.    . Multiple Vitamins-Minerals (MULTIVITAMIN WITH MINERALS) tablet Take 1 tablet by mouth daily.    . rosuvastatin (CRESTOR) 5 MG tablet Take 1 tablet (5 mg total) by mouth 2 (two) times a week. 60 tablet 2  . amLODipine (NORVASC) 2.5 MG tablet Take 1 tablet (2.5 mg total) by mouth daily. 90 tablet 1   No facility-administered medications prior to visit.   Meds  ordered this encounter  Medications  . losartan (COZAAR) 50 MG tablet    Sig: Take 1 tablet (50 mg total) by mouth every evening.    Dispense:  30 tablet    Refill:  2  . aspirin EC 81 MG tablet    Sig: Take 1 tablet (81 mg total) by mouth daily.    Dispense:  90 tablet    Refill:  3   Medications Discontinued During This Encounter  Medication Reason  . amLODipine (NORVASC) 2.5 MG tablet Change in therapy    On Amlodipine 5 mg presently.  Cardiovascular & other pertient studies:  CT of the chest without contrast: 03/26/2020: Coronary calcification noted.  Ascending thoracic aorta measures 3.7 cm.  Atherosclerotic changes noted in the thoracic aorta. Centrilobular and paraseptal emphysema.  4 mm left upper lobe nodule.  CT angiogram of the abdomen and pelvis 03/26/2020: Fusiform aneurysmal dilatation of the abdominal aorta, tortuous course of the abdominal aorta.  Maximum transaxial orthogonal diameter is 4.5 cm.  Anderson dilatation starts above the renal arteries and extends below the renal arteries.  EKG   EKG 07/11/2020: Normal sinus rhythm at rate of 80 bpm, left axis deviation, left anterior fascicular block.  Incomplete right bundle branch block.  T wave abnormality, cannot exclude anteroseptal ischemia.  Compared to 09/07/2019, T wave  abnormality slightly more pronounced.  Carotid artery duplex 01/03/2020:  Stenosis in the right internal carotid artery (50-69%).  Stenosis in the left common carotid artery (<50%).  Antegrade right vertebral artery flow. Antegrade left vertebral artery  flow.  Follow up in six months is appropriate if clinically indicated.  Compared to 01/02/2019, progressing of disease, but compared to prior  carotid duplex 08/02/2018, no change. recheck in 6 months.   Hospital echocardiogram 11/10/2017:  - Left ventricle: mild inferior wall hypokinesis. The cavity size  was normal. Systolic function was normal. The estimated ejection  fraction was in  the range of 50% to 55%. Wall motion was normal;  there were no regional wall motion abnormalities. Left  ventricular diastolic function parameters were normal. - Left atrium: The atrium was mildly dilated. - Right ventricle: The cavity size was mildly dilated. Wall  thickness was normal. - Right atrium: The atrium was mildly dilated. - Atrial septum: No defect or patent foramen ovale was identified.  Recent labs: 09/27/2019: Glucose 130, BUN/Cr 39/1.21. EGFR 62. Na/K 138/5.4.  H/H 12.7/39.5. MCV 94. Platelets 262 HbA1C ?6% Chol 229, TG 118, HDL 71, LDL 137  Review of Systems  Cardiovascular: Negative for chest pain, dyspnea on exertion, leg swelling, palpitations and syncope.    Vitals:   07/11/20 1316  BP: 135/81  Pulse: 77  Resp: 17  SpO2: 98%     Objective:   Physical Exam Cardiovascular:     Rate and Rhythm: Normal rate and regular rhythm.     Pulses: Intact distal pulses.          Carotid pulses are 2+ on the right side with bruit and 2+ on the left side with bruit.      Femoral pulses are 2+ on the right side and 2+ on the left side.      Popliteal pulses are 1+ on the right side and 1+ on the left side.       Dorsalis pedis pulses are 1+ on the right side and 1+ on the left side.       Posterior tibial pulses are 1+ on the right side and 0 on the left side.     Heart sounds: Normal heart sounds. No murmur heard.  No gallop.      Comments: No leg edema, no JVD. Pulmonary:     Effort: Pulmonary effort is normal.     Breath sounds: Normal breath sounds.  Abdominal:     General: Bowel sounds are normal.     Palpations: Abdomen is soft.       Assessment & Recommendations:   69 year old Caucasian male with asymptomatic carotid artery stenosis, hypertension, AAA, hyperlipidemia, prior tobacco use.    ICD-10-CM   1. AAA (abdominal aortic aneurysm) without rupture (HCC)  I71.4 losartan (COZAAR) 50 MG tablet    PCV AORTA DUPLEX  2. Essential hypertension   I10 EKG 12-Lead  3. Bilateral carotid artery stenosis  I65.23 aspirin EC 81 MG tablet  4. Mixed hyperlipidemia  E78.2      AAA: 4.5 cm AAA Conitnue heart rate, blood pressure control. I will add losartan for both hypertension and for AAA.  Patient and his wife are aware of development of renal failure and hyperkalemia, he has a scheduled appointment for complete physical with his PCP, he is aware that he needs to have labs done 10 days to 2 weeks after he start the medication.  He has been evaluated by vascular surgery and although recommendation  for repeat CTA in 6 months per radiology, we could certainly repeat abdominal ultrasound again as there is relatively good correlation.  Understanding tortuosity will probably help with continued surveillance.   Carotid artery stenosis: Asymptomatic, moderate right, mild left carotid artery stenosis. Continue surveillance.  Is tolerating Crestor, his lipids are being now managed by PCP but goal LDL <70. Recommend aspirin 81 mg daily.     Hyperlipidemia: Patient's wife discussed A1c elevation but I have discussed regarding protection given by the statins and they are willing to continue. Patient instructed to start ASA  83m q daily for prophylaxis.  He also probably has peripheral arterial disease as he has reduced pulses in his lower extremity.  He remains asymptomatic with regard to PAD.  F/u in 6 months with MP.      JAdrian Prows MD, [facility]8/05/2020, 2:17 PM Office: [phone]

## 2020-07-15 IMAGING — US US RENAL
1 series · 14 of 25 positions shown · non-contrast
Comparison: None.

CLINICAL DATA: Stage 3 chronic kidney disease

EXAM:
RENAL / URINARY TRACT ULTRASOUND COMPLETE

[Series 1: us renal · 0.25mm/px · 14 of 42 slices shown]
[im 1/42]
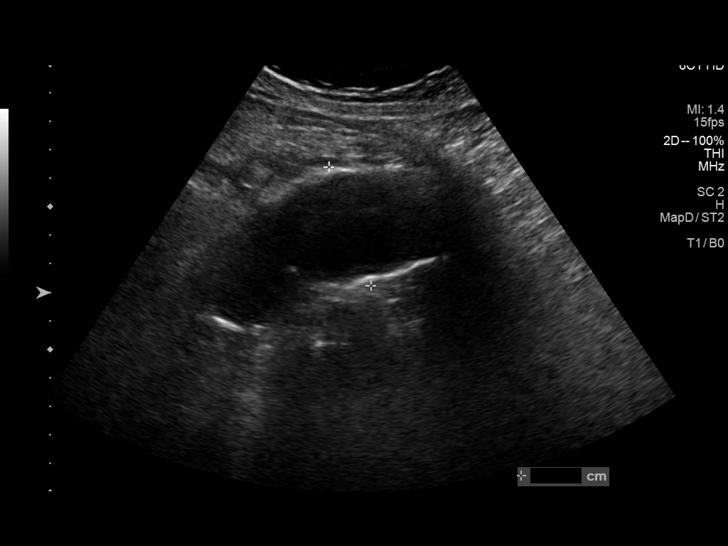
[im 4/42]
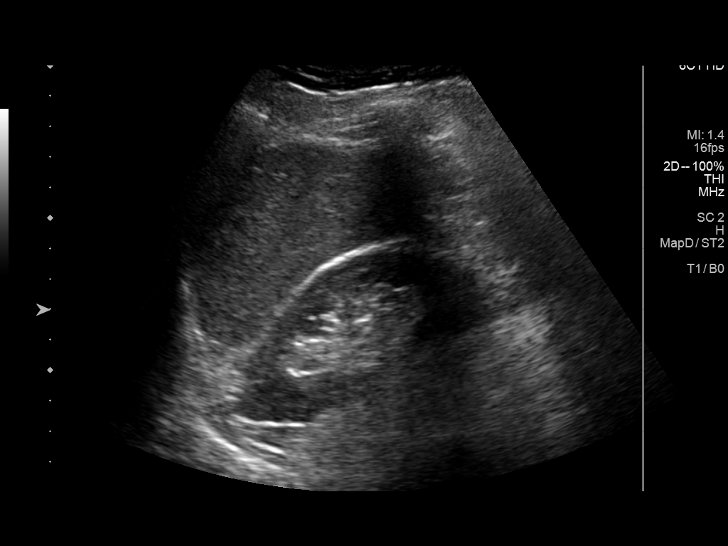
[im 7/42]
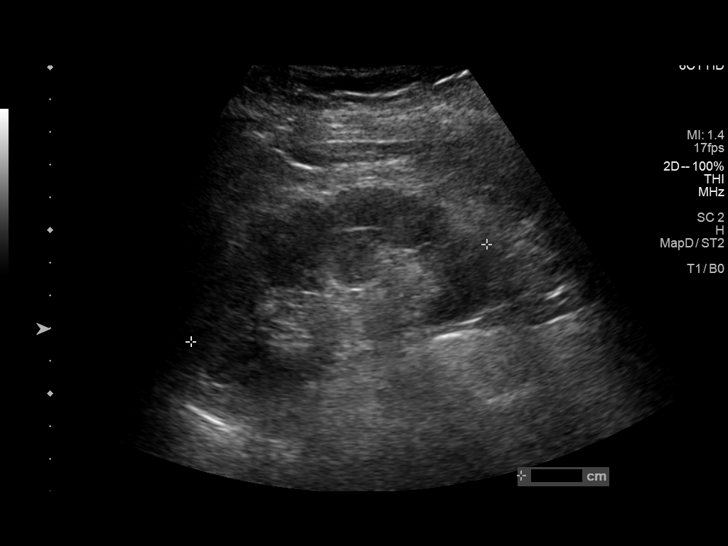
[im 11/42]
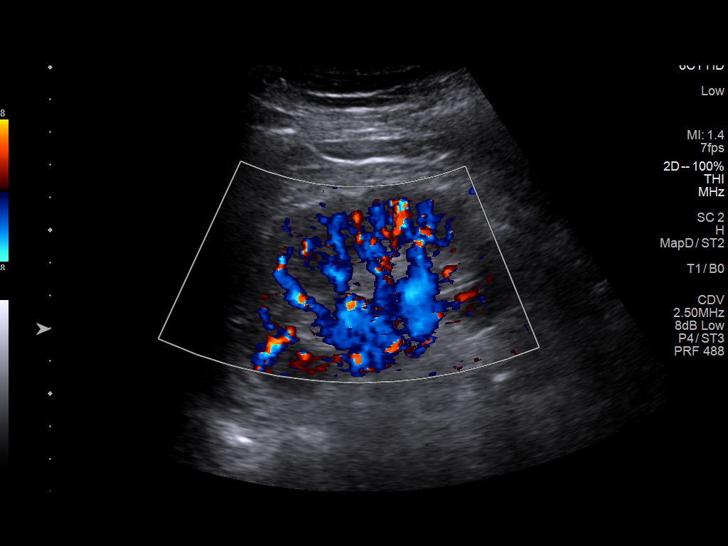
[im 14/42]
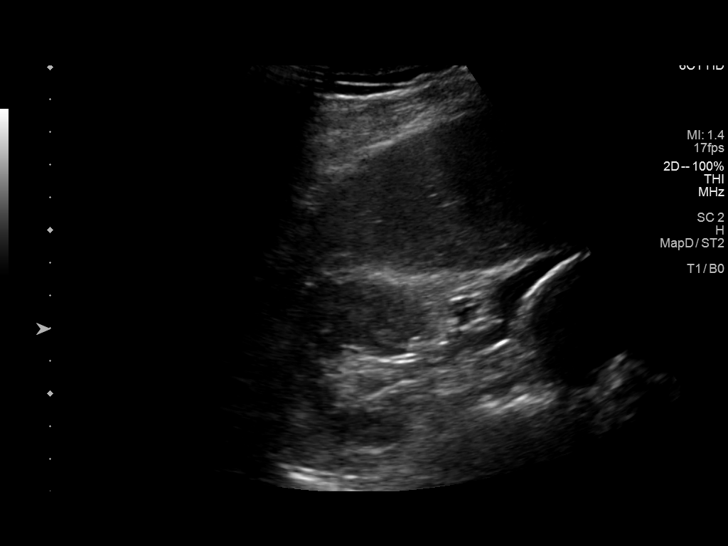
[im 16/42]
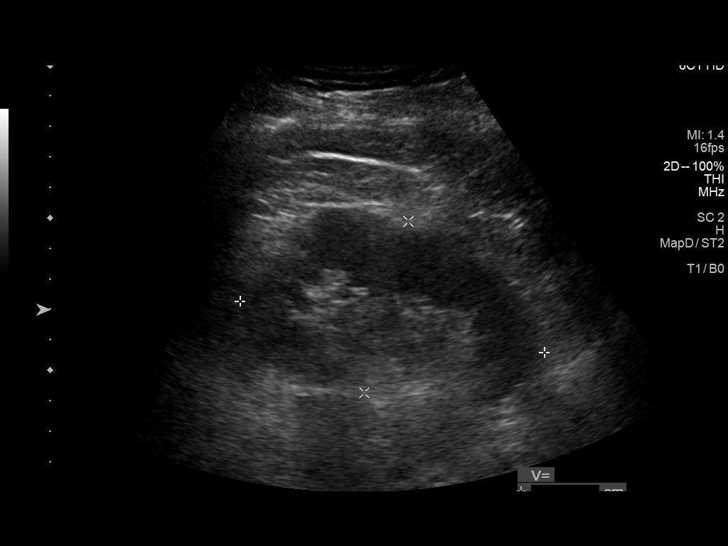
[im 19/42]
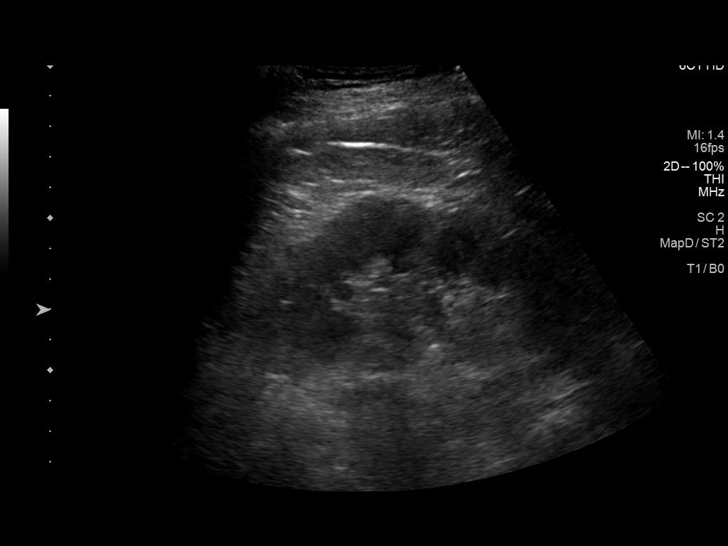
[im 23/42]
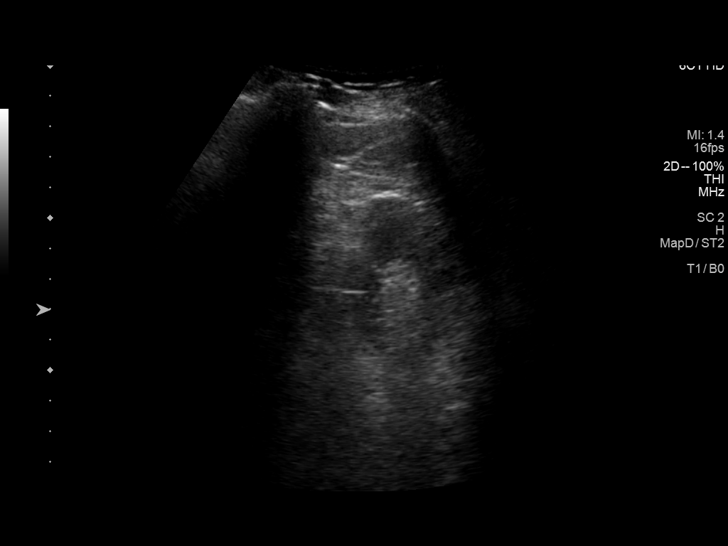
[im 26/42]
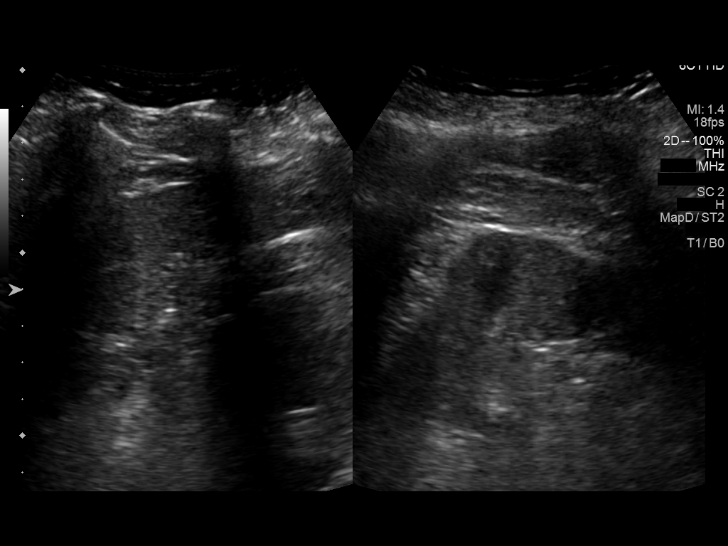
[im 28/42]
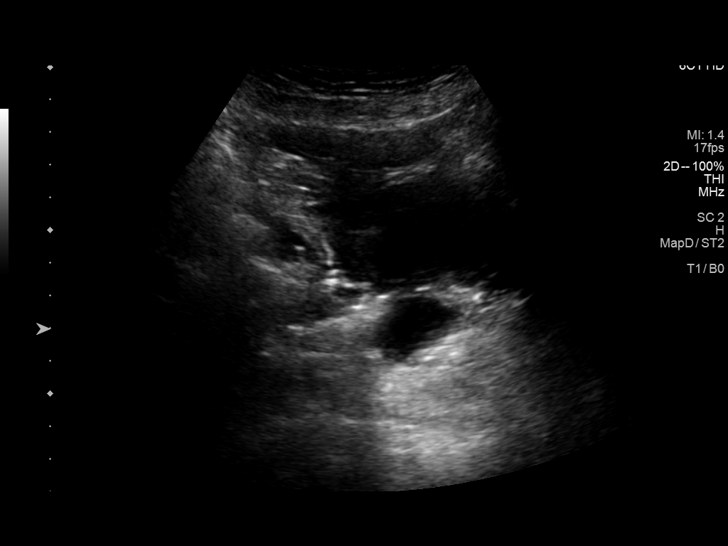
[im 31/42]
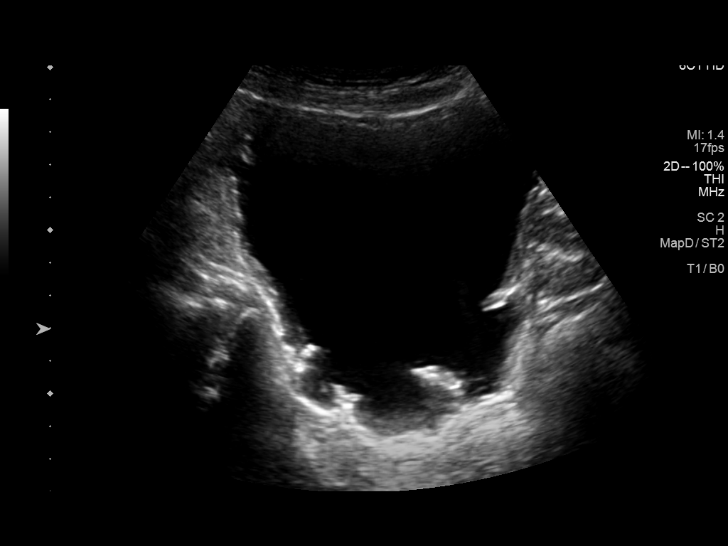
[im 35/42]
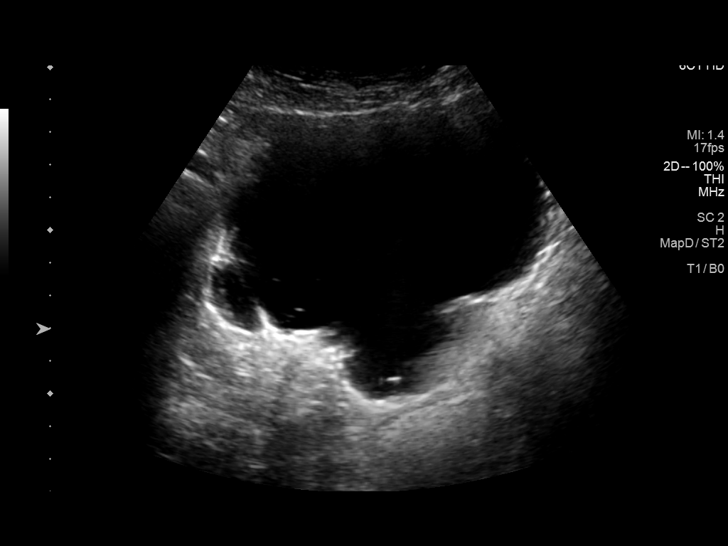
[im 38/42]
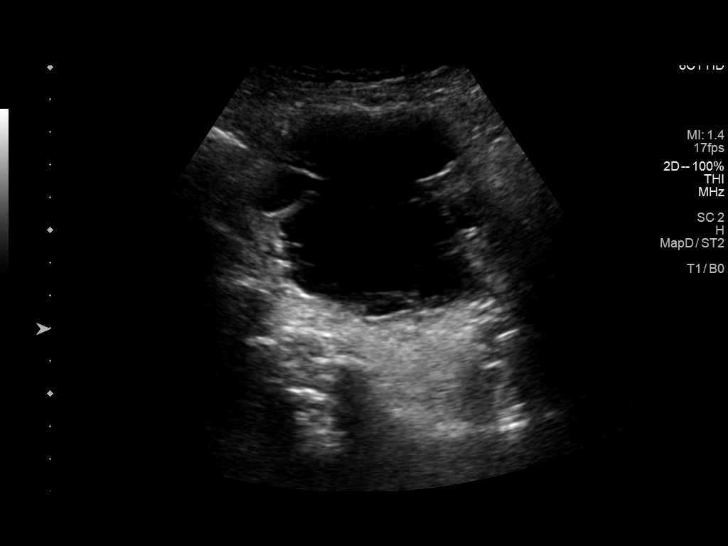
[im 42/42]
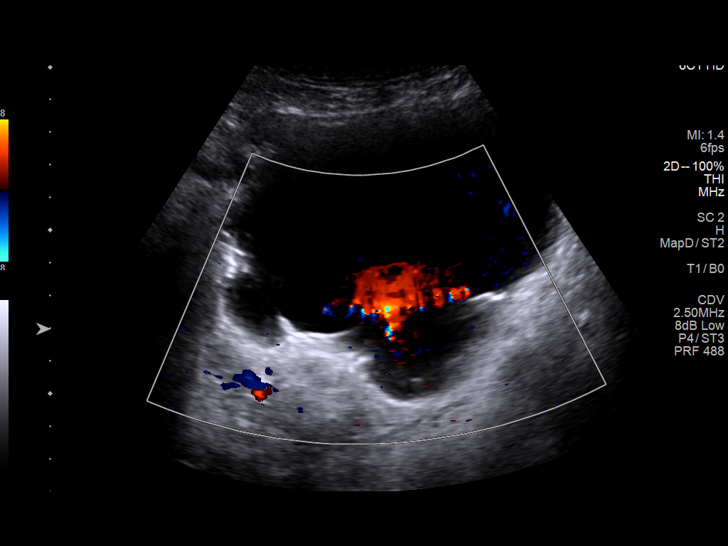

[14 of 25 positions shown; findings below may reference images not displayed]

FINDINGS: Right Kidney:

Renal measurements: 9.8 x 5.2 x 4.8 cm = volume: 126 mL .
Echogenicity within normal limits. No mass or hydronephrosis
visualized.

Left Kidney:

Renal measurements: 10.1 x 5.8 x 4.9 cm = volume: 152 mL.
Echogenicity within normal limits. No mass or hydronephrosis
visualized.

Bladder:

Multiple bladder diverticula.

Other:

Abdominal aortic aneurysm measuring 4.4 cm AP by 5.3 cm transverse.
IMPRESSION: 1. Negative ultrasound appearance of the kidneys
2. Multiple bladder diverticula
3. Abdominal aortic aneurysm measuring up to at least 5.3 cm.
Recommend followup by abdomen and pelvis CTA in 3-6 months, and
vascular surgery referral/consultation if not already obtained. This
recommendation follows ACR consensus guidelines: White Paper of the
ACR Incidental Findings Committee II on Vascular Findings. [HOSPITAL] 4974; [DATE].

These results will be called to the ordering clinician or
representative by the Radiologist Assistant, and communication
documented in the PACS or [REDACTED].

## 2020-07-21 NOTE — Telephone Encounter (Signed)
Recommend reducing losartan to 1/2 tab of 50 mg. Please f/u w/Dr Jacelyn Grip to check on BP. Will need repeat bloodwork, which could be done wither with [place] or Dr. Jacelyn Grip. If he would like [place] to check it, please place a BMP order.  Thanks MJP

## 2020-07-21 NOTE — Telephone Encounter (Signed)
From pt

## 2020-07-23 ENCOUNTER — Telehealth: Payer: Self-pay

## 2020-07-23 NOTE — Telephone Encounter (Signed)
Called pt to inform him about his losartan to cut it in half and to repeat his blood work, pt mention he would repeat it with his pcp

## 2020-07-28 DIAGNOSIS — E7800 Pure hypercholesterolemia, unspecified: Secondary | ICD-10-CM | POA: Diagnosis not present

## 2020-07-28 DIAGNOSIS — Z125 Encounter for screening for malignant neoplasm of prostate: Secondary | ICD-10-CM | POA: Diagnosis not present

## 2020-07-28 DIAGNOSIS — R7301 Impaired fasting glucose: Secondary | ICD-10-CM | POA: Diagnosis not present

## 2020-08-08 ENCOUNTER — Other Ambulatory Visit: Payer: Self-pay

## 2020-08-08 ENCOUNTER — Other Ambulatory Visit: Payer: Self-pay | Admitting: Cardiology

## 2020-08-08 MED ORDER — ROSUVASTATIN CALCIUM 5 MG PO TABS: 5.0000 mg | ORAL_TABLET | Freq: Every day | ORAL | 1 refills | Status: DC

## 2020-08-08 NOTE — Telephone Encounter (Signed)
Sure. Arloa Koh try 5 mg every day.

## 2020-08-08 NOTE — Progress Notes (Signed)
Labs 07/28/2020: Glucose 119.  BUN/creatinine 18/1.13.  EGFR 65.  NA/K1 35/4.7.  Rest of the CMP normal. Chol 231, TG 109, HDL 60, LDL 152 HbA1c 6.0%

## 2020-08-08 NOTE — Telephone Encounter (Signed)
I understand you have had difficulty with statins in the past.  Please let me know if Crestor 20 mg every day is acceptable to you.  If so, I will send a new prescription.  Regards, Dr. Virgina Jock

## 2020-08-13 NOTE — Telephone Encounter (Signed)
Can try docusate 200 mg nightly. Please send 30 pillsX2 refills.  Thanks MJP

## 2020-08-14 ENCOUNTER — Other Ambulatory Visit: Payer: Self-pay

## 2020-08-14 MED ORDER — DOCUSATE SODIUM 100 MG PO CAPS: 200.0000 mg | ORAL_CAPSULE | Freq: Every evening | ORAL | 3 refills | Status: DC

## 2020-08-15 ENCOUNTER — Other Ambulatory Visit: Payer: Self-pay | Admitting: Cardiology

## 2020-08-18 DIAGNOSIS — I6523 Occlusion and stenosis of bilateral carotid arteries: Secondary | ICD-10-CM | POA: Diagnosis not present

## 2020-08-18 DIAGNOSIS — Z0000 Encounter for general adult medical examination without abnormal findings: Secondary | ICD-10-CM | POA: Diagnosis not present

## 2020-08-18 DIAGNOSIS — E7800 Pure hypercholesterolemia, unspecified: Secondary | ICD-10-CM | POA: Diagnosis not present

## 2020-08-18 DIAGNOSIS — R7989 Other specified abnormal findings of blood chemistry: Secondary | ICD-10-CM | POA: Diagnosis not present

## 2020-08-18 DIAGNOSIS — R7301 Impaired fasting glucose: Secondary | ICD-10-CM | POA: Diagnosis not present

## 2020-08-18 DIAGNOSIS — I714 Abdominal aortic aneurysm, without rupture: Secondary | ICD-10-CM | POA: Diagnosis not present

## 2020-08-19 ENCOUNTER — Other Ambulatory Visit: Payer: Self-pay

## 2020-08-19 MED ORDER — ROSUVASTATIN CALCIUM 5 MG PO TABS: 5.0000 mg | ORAL_TABLET | Freq: Every day | ORAL | 2 refills | Status: DC

## 2020-08-19 MED ORDER — ROSUVASTATIN CALCIUM 20 MG PO TABS: 20.0000 mg | ORAL_TABLET | Freq: Every day | ORAL | 2 refills | Status: DC

## 2020-09-04 NOTE — Telephone Encounter (Signed)
From pt

## 2020-09-04 NOTE — Telephone Encounter (Signed)
Yes. BP readings are okay. Rosuvastatin 10 mg is not optimal, but may continue at 10 mg for now.

## 2020-09-04 NOTE — Telephone Encounter (Signed)
Thank you for message. Let me see Mr. Ian [name] in office sometime. We will go over his blood pressure readings and alternate options for cholesterol management.  Staff, please arrange f/u.  Thanks MJP

## 2020-09-17 DIAGNOSIS — M1712 Unilateral primary osteoarthritis, left knee: Secondary | ICD-10-CM | POA: Diagnosis not present

## 2020-09-17 DIAGNOSIS — M25562 Pain in left knee: Secondary | ICD-10-CM | POA: Diagnosis not present

## 2020-09-18 NOTE — Telephone Encounter (Signed)
Okay with me, for short term. Okay to perform low intensity aerobic exercises.  Early happy birthday to him!

## 2020-09-24 ENCOUNTER — Telehealth: Payer: Self-pay

## 2020-09-24 NOTE — Telephone Encounter (Signed)
Spoke with patient's wife concerning some back pain symptoms. He has a known aneurysm that was 4.5 cm in June. He is c/o dull lumber pain that comes and goes and is an aching quality. Rated around a 4 on 0-10 pain scale. Denies any SOB, dizziness, sharp pains or feelings of impending doom. BP has been in the 120 range per patient. Spoke with Dr. Oneida Alar and will get him in earlier than the 6 month f/u. Will call when scheduled. Patient and wife verbalized understanding.

## 2020-09-25 ENCOUNTER — Other Ambulatory Visit: Payer: Self-pay

## 2020-09-25 DIAGNOSIS — I714 Abdominal aortic aneurysm, without rupture, unspecified: Secondary | ICD-10-CM

## 2020-09-25 DIAGNOSIS — I712 Thoracic aortic aneurysm, without rupture, unspecified: Secondary | ICD-10-CM

## 2020-09-25 DIAGNOSIS — I722 Aneurysm of renal artery: Secondary | ICD-10-CM

## 2020-09-25 DIAGNOSIS — I716 Thoracoabdominal aortic aneurysm, without rupture, unspecified: Secondary | ICD-10-CM

## 2020-09-29 ENCOUNTER — Other Ambulatory Visit: Payer: Self-pay

## 2020-09-29 ENCOUNTER — Ambulatory Visit
Admission: RE | Admit: 2020-09-29 | Discharge: 2020-09-29 | Disposition: A | Discharge status: Home / Self Care | Payer: Medicare HMO | Source: Ambulatory Visit | Attending: Vascular Surgery | Admitting: Vascular Surgery

## 2020-09-29 DIAGNOSIS — N3289 Other specified disorders of bladder: Secondary | ICD-10-CM | POA: Diagnosis not present

## 2020-09-29 DIAGNOSIS — I7789 Other specified disorders of arteries and arterioles: Secondary | ICD-10-CM | POA: Diagnosis not present

## 2020-09-29 DIAGNOSIS — N400 Enlarged prostate without lower urinary tract symptoms: Secondary | ICD-10-CM | POA: Diagnosis not present

## 2020-09-29 DIAGNOSIS — I714 Abdominal aortic aneurysm, without rupture, unspecified: Secondary | ICD-10-CM

## 2020-09-29 DIAGNOSIS — I701 Atherosclerosis of renal artery: Secondary | ICD-10-CM | POA: Diagnosis not present

## 2020-09-29 DIAGNOSIS — I712 Thoracic aortic aneurysm, without rupture: Secondary | ICD-10-CM | POA: Diagnosis not present

## 2020-09-29 DIAGNOSIS — J439 Emphysema, unspecified: Secondary | ICD-10-CM | POA: Diagnosis not present

## 2020-09-29 MED ORDER — IOPAMIDOL (ISOVUE-370) INJECTION 76%
100.0000 mL | Freq: Once | INTRAVENOUS | Status: AC | PRN
  Administered 2020-09-29: 100 mL via INTRAVENOUS

## 2020-09-30 NOTE — Telephone Encounter (Signed)
From patient.

## 2020-10-02 ENCOUNTER — Encounter: Payer: Self-pay | Admitting: Vascular Surgery

## 2020-10-02 ENCOUNTER — Other Ambulatory Visit: Payer: Self-pay

## 2020-10-02 ENCOUNTER — Ambulatory Visit: Payer: Medicare HMO | Admitting: Vascular Surgery

## 2020-10-02 ENCOUNTER — Telehealth: Payer: Self-pay

## 2020-10-02 ENCOUNTER — Other Ambulatory Visit: Payer: Self-pay | Admitting: Cardiology

## 2020-10-02 VITALS — BP 120/83 | HR 82 | Temp 98.2°F | Resp 20 | Ht 69.0 in | Wt 152.7 lb

## 2020-10-02 DIAGNOSIS — I716 Thoracoabdominal aortic aneurysm, without rupture, unspecified: Secondary | ICD-10-CM

## 2020-10-02 DIAGNOSIS — I714 Abdominal aortic aneurysm, without rupture, unspecified: Secondary | ICD-10-CM

## 2020-10-02 NOTE — Telephone Encounter (Signed)
Patient's wife called to ask if patient could get Covid Booster. Told her absolutely he was fine to get the vaccine booster.  She verbalized understanding.

## 2020-10-02 NOTE — Progress Notes (Signed)
Patient is a 69 year old male who returns for follow-up today.  He was last seen June 2021.  He is a known type IV thoracoabdominal aortic aneurysm.  He apparently was having more back pain recently so a CT scan was obtained a few days ago.  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  However this was transient in nature and has not been continuous.  He has no abdominal pain.  He is on a statin and aspirin.  Other chronic medical problems include hypertension which has been controlled.  Past Medical History:  Diagnosis Date   Chronic kidney disease    Hypercholesteremia     Past Surgical History:  Procedure Laterality Date   NOSE SURGERY      Current Outpatient Medications on File Prior to Visit  Medication Sig Dispense Refill   amLODipine (NORVASC) 5 MG tablet TAKE 1 TABLET BY MOUTH EVERY DAY 90 tablet 1   aspirin EC 81 MG tablet Take 1 tablet (81 mg total) by mouth daily. 90 tablet 3   Coenzyme Q10 (CO Q10) 100 MG CAPS Take 1 capsule by mouth daily.     docusate sodium (COLACE) 100 MG capsule Take 2 capsules (200 mg total) by mouth at bedtime. (Patient taking differently: Take 200 mg by mouth daily as needed. ) 60 capsule 3   Multiple Vitamins-Minerals (MULTIVITAMIN WITH MINERALS) tablet Take 1 tablet by mouth daily.     rosuvastatin (CRESTOR) 20 MG tablet Take 1 tablet (20 mg total) by mouth daily. (Patient taking differently: Take 20 mg by mouth 2 (two) times a week. ) 60 tablet 2   No current facility-administered medications on file prior to visit.    Review of systems: He gets short of breath with exertion.  He does not have any chest pain.  Physical exam:  Vitals:   10/02/20 1058  BP: 120/83  Pulse: 82  Resp: 20  Temp: 98.2 F (36.8 C)  SpO2: 95%  Weight: 152 lb 11.2 oz (69.3 kg)  Height: 5\' 9"  (1.753 m)    Abdomen: Soft nontender nondistended vaguely palpable pulsatile mass 2+ femoral  pulses  Extremities: No cyanosis  Data: Patient had a CT scan of the chest abdomen and pelvis without contrast due to renal dysfunction.  This was on September 29, 2020.  Aortic root was 4 cm ascending aorta 3.6 cm,  possible left common carotid stenosis left subclavian proximal stenosis beyond the vertebral.  Descending thoracic 3.1 cm to 3.4 cm.  Juxtarenal aorta is 4 cm.  Infrarenal portion is 4.5 cm.  Assessment: Stable type IV thoracoabdominal aneurysm no significant change from 4 months ago.  Some narrowing of the left common carotid left subclavian artery currently asymptomatic  Plan: Follow-up CT angio chest abdomen pelvis in 6 months.  Ruta Hinds, MD [facility] Office: [phone]

## 2020-10-09 ENCOUNTER — Other Ambulatory Visit: Payer: Medicare HMO

## 2020-10-15 DIAGNOSIS — M1712 Unilateral primary osteoarthritis, left knee: Secondary | ICD-10-CM | POA: Diagnosis not present

## 2020-11-12 DIAGNOSIS — M1712 Unilateral primary osteoarthritis, left knee: Secondary | ICD-10-CM | POA: Diagnosis not present

## 2020-11-17 ENCOUNTER — Other Ambulatory Visit: Payer: Self-pay

## 2020-11-17 DIAGNOSIS — I714 Abdominal aortic aneurysm, without rupture, unspecified: Secondary | ICD-10-CM

## 2020-11-17 MED ORDER — LOSARTAN POTASSIUM 25 MG PO TABS
25.0000 mg | ORAL_TABLET | Freq: Every day | ORAL | 0 refills | Status: DC
Start: 2020-11-17 — End: 2021-06-02

## 2020-11-19 DIAGNOSIS — M1712 Unilateral primary osteoarthritis, left knee: Secondary | ICD-10-CM | POA: Diagnosis not present

## 2020-11-21 DIAGNOSIS — Z0000 Encounter for general adult medical examination without abnormal findings: Secondary | ICD-10-CM | POA: Diagnosis not present

## 2020-11-21 DIAGNOSIS — I6523 Occlusion and stenosis of bilateral carotid arteries: Secondary | ICD-10-CM | POA: Diagnosis not present

## 2020-11-21 DIAGNOSIS — E7800 Pure hypercholesterolemia, unspecified: Secondary | ICD-10-CM | POA: Diagnosis not present

## 2020-11-21 DIAGNOSIS — R7989 Other specified abnormal findings of blood chemistry: Secondary | ICD-10-CM | POA: Diagnosis not present

## 2020-11-21 DIAGNOSIS — I714 Abdominal aortic aneurysm, without rupture: Secondary | ICD-10-CM | POA: Diagnosis not present

## 2020-11-21 DIAGNOSIS — R7301 Impaired fasting glucose: Secondary | ICD-10-CM | POA: Diagnosis not present

## 2020-11-24 NOTE — Telephone Encounter (Signed)
Nicotine gum may be a better option. I defer anxiety management to PCP, as I do not have that expertise. If he would like nicotine patch, recommend 60 X2 refills.

## 2020-11-26 DIAGNOSIS — M1712 Unilateral primary osteoarthritis, left knee: Secondary | ICD-10-CM | POA: Diagnosis not present

## 2020-12-08 NOTE — Telephone Encounter (Signed)
Cholesterol is remarkably improved. Continue current medications.  Since Dr. Darrick Penna is following AAA with CT scan, we can cancel ultrasound scheduled for January.  Keep appt with me in February.  Thanks MJP

## 2020-12-08 NOTE — Telephone Encounter (Signed)
From patient.

## 2020-12-12 NOTE — Telephone Encounter (Signed)
From patient.

## 2020-12-29 ENCOUNTER — Other Ambulatory Visit: Payer: Medicare HMO

## 2020-12-30 ENCOUNTER — Telehealth: Payer: Self-pay

## 2020-12-30 ENCOUNTER — Other Ambulatory Visit: Payer: Self-pay

## 2020-12-30 ENCOUNTER — Ambulatory Visit: Payer: Managed Care, Other (non HMO)

## 2020-12-30 DIAGNOSIS — I6523 Occlusion and stenosis of bilateral carotid arteries: Secondary | ICD-10-CM | POA: Diagnosis not present

## 2020-12-30 NOTE — Telephone Encounter (Signed)
When pt checked out from his echo he asked if he needed to come in for his f/u appt on 01/12/2021. If not, he wants the results sent to his mychart and a telephone call w/results. Please advise.

## 2021-01-04 NOTE — Telephone Encounter (Signed)
Can you tell me when did he have echo? I see carotid results. Mild to moderate carotid artery disease. Continue current medications. Okay to see him sometime in spring/summer instead.  Thanks MJP

## 2021-01-05 NOTE — Telephone Encounter (Signed)
Patient is aware of results, called transferred to 115  for patient to schedule appointment.

## 2021-01-05 NOTE — Telephone Encounter (Signed)
Sorry, meant carotid. I spoke with pt and he wanted to now if he could schedule appt for next January. Please advise

## 2021-01-06 ENCOUNTER — Other Ambulatory Visit: Payer: Self-pay | Admitting: Cardiology

## 2021-01-06 DIAGNOSIS — I6523 Occlusion and stenosis of bilateral carotid arteries: Secondary | ICD-10-CM

## 2021-01-12 ENCOUNTER — Ambulatory Visit: Payer: Medicare HMO | Admitting: Cardiology

## 2021-01-23 DIAGNOSIS — H524 Presbyopia: Secondary | ICD-10-CM | POA: Diagnosis not present

## 2021-01-23 DIAGNOSIS — H2513 Age-related nuclear cataract, bilateral: Secondary | ICD-10-CM | POA: Diagnosis not present

## 2021-01-23 DIAGNOSIS — H5203 Hypermetropia, bilateral: Secondary | ICD-10-CM | POA: Diagnosis not present

## 2021-01-28 ENCOUNTER — Other Ambulatory Visit: Payer: Self-pay | Admitting: Cardiology

## 2021-02-05 DIAGNOSIS — B078 Other viral warts: Secondary | ICD-10-CM | POA: Diagnosis not present

## 2021-02-05 DIAGNOSIS — L570 Actinic keratosis: Secondary | ICD-10-CM | POA: Diagnosis not present

## 2021-02-05 DIAGNOSIS — L814 Other melanin hyperpigmentation: Secondary | ICD-10-CM | POA: Diagnosis not present

## 2021-02-05 DIAGNOSIS — Z85828 Personal history of other malignant neoplasm of skin: Secondary | ICD-10-CM | POA: Diagnosis not present

## 2021-02-05 DIAGNOSIS — D485 Neoplasm of uncertain behavior of skin: Secondary | ICD-10-CM | POA: Diagnosis not present

## 2021-02-11 ENCOUNTER — Other Ambulatory Visit: Payer: Self-pay | Admitting: Cardiology

## 2021-02-17 NOTE — Telephone Encounter (Signed)
From pt

## 2021-03-17 NOTE — Telephone Encounter (Signed)
Okay to try 1/2 tablet daily.  Thanks MJP

## 2021-04-04 ENCOUNTER — Other Ambulatory Visit: Payer: Self-pay | Admitting: Cardiology

## 2021-04-09 ENCOUNTER — Other Ambulatory Visit: Payer: Self-pay

## 2021-04-09 DIAGNOSIS — I716 Thoracoabdominal aortic aneurysm, without rupture, unspecified: Secondary | ICD-10-CM

## 2021-04-09 DIAGNOSIS — I712 Thoracic aortic aneurysm, without rupture, unspecified: Secondary | ICD-10-CM

## 2021-04-16 ENCOUNTER — Ambulatory Visit: Payer: Medicare HMO | Admitting: Vascular Surgery

## 2021-04-24 ENCOUNTER — Other Ambulatory Visit: Payer: Self-pay

## 2021-04-24 DIAGNOSIS — I716 Thoracoabdominal aortic aneurysm, without rupture, unspecified: Secondary | ICD-10-CM

## 2021-04-24 DIAGNOSIS — I712 Thoracic aortic aneurysm, without rupture, unspecified: Secondary | ICD-10-CM

## 2021-05-05 ENCOUNTER — Ambulatory Visit
Admission: RE | Admit: 2021-05-05 | Discharge: 2021-05-05 | Disposition: A | Discharge status: Home / Self Care | Payer: Medicare HMO | Source: Ambulatory Visit | Attending: Vascular Surgery | Admitting: Vascular Surgery

## 2021-05-05 DIAGNOSIS — I712 Thoracic aortic aneurysm, without rupture, unspecified: Secondary | ICD-10-CM

## 2021-05-05 DIAGNOSIS — J439 Emphysema, unspecified: Secondary | ICD-10-CM | POA: Diagnosis not present

## 2021-05-05 DIAGNOSIS — I716 Thoracoabdominal aortic aneurysm, without rupture, unspecified: Secondary | ICD-10-CM

## 2021-05-05 DIAGNOSIS — I2510 Atherosclerotic heart disease of native coronary artery without angina pectoris: Secondary | ICD-10-CM | POA: Diagnosis not present

## 2021-05-05 DIAGNOSIS — K5730 Diverticulosis of large intestine without perforation or abscess without bleeding: Secondary | ICD-10-CM | POA: Diagnosis not present

## 2021-05-05 DIAGNOSIS — I722 Aneurysm of renal artery: Secondary | ICD-10-CM | POA: Diagnosis not present

## 2021-05-05 DIAGNOSIS — I723 Aneurysm of iliac artery: Secondary | ICD-10-CM | POA: Diagnosis not present

## 2021-05-05 MED ORDER — IOPAMIDOL (ISOVUE-370) INJECTION 76%
75.0000 mL | Freq: Once | INTRAVENOUS | Status: AC | PRN
  Administered 2021-05-05: 75 mL via INTRAVENOUS

## 2021-05-05 NOTE — Progress Notes (Signed)
Follow up visit. Patient seen by me Dr. Adrian Prows in the absence of Dr. Virgina Jock.   Subjective:   Ian [name], male    DOB: 10-03-1951, 70 y.o.   MRN: [id number]   HPI   Chief Complaint  Patient presents with  . AAA  . Follow-up    70 year old Caucasian male with hypertension, mixed hyperlipidemia, AAA, asymptomatic mild carotid artery stenosis  Patient is doing well. He denies chest pain, shortness of breath, palpitations, leg edema, orthopnea, PND, TIA/syncope. Reviewed CT scan performed 05/05/2021, details below.   Blood pressure is well controlled. Heart rate remains around 100 bpm. He is currently taking Crestor 20 mg twice a week. He is reluctant to take 20 mg everyday.  On a separate note, he needs colonoscopy in the near future, given his prior h/o adenoma ans polyps.    Current Outpatient Medications on File Prior to Visit  Medication Sig Dispense Refill  . amLODipine (NORVASC) 5 MG tablet TAKE 1 TABLET BY MOUTH EVERY DAY 90 tablet 1  . aspirin EC 81 MG tablet Take 1 tablet (81 mg total) by mouth daily. 90 tablet 3  . Coenzyme Q10 (CO Q10) 100 MG CAPS Take 1 capsule by mouth daily.    Marland Kitchen losartan (COZAAR) 25 MG tablet Take 1 tablet (25 mg total) by mouth daily. 90 tablet 0  . Multiple Vitamins-Minerals (MULTIVITAMIN WITH MINERALS) tablet Take 1 tablet by mouth daily.    . rosuvastatin (CRESTOR) 20 MG tablet TAKE 1 TABLET BY MOUTH EVERY DAY 30 tablet 5   No current facility-administered medications on file prior to visit.     Cardiovascular & other pertient studies:  EKG 05/06/2021: Sinus rhythm 98 bpm IVCD  CTA abdomen pelvis 05/05/2021: 1. Stable 3.1 cm aortic arch and 4.8 cm juxtarenal/infrarenal aortic aneurysms. Recommend follow-up CT/MR every 6 months (the patient is currently under the care of a vascular specialist/surgeon). 2. 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 3. Left renal artery stenosis of possible hemodynamic  significance. 4. Coronary and Aortic Atherosclerosis (ICD10-170.0). 5.  Emphysema (ICD10-J43.9). 6. Prostate enlargement with trabeculated bladder suggesting a degree of bladder outlet obstruction. 7. Colonic diverticulosis.   Carotid artery duplex 12/30/2020:  Stenosis in the right internal carotid artery (50-69%). Right external  carotid stenosis of <50%  Stenosis in the left common carotid artery (<50%).  Antegrade right vertebral artery flow. Antegrade left vertebral artery  flow.  Follow up in six months is appropriate if clinically indicated.  Compared to 06/19/2020, no significant change.   CT of the chest without contrast: 03/26/2020: Coronary calcification noted.  Ascending thoracic aorta measures 3.7 cm.  Atherosclerotic changes noted in the thoracic aorta. Centrilobular and paraseptal emphysema.  4 mm left upper lobe nodule.  CT angiogram of the abdomen and pelvis 03/26/2020: Fusiform aneurysmal dilatation of the abdominal aorta, tortuous course of the abdominal aorta.  Maximum transaxial orthogonal diameter is 4.5 cm.  Anderson dilatation starts above the renal arteries and extends below the renal arteries.   EKG 07/11/2020: Normal sinus rhythm at rate of 80 bpm, left axis deviation, left anterior fascicular block.  Incomplete right bundle branch block.  T wave abnormality, cannot exclude anteroseptal ischemia.  Compared to 09/07/2019, T wave abnormality slightly more pronounced.  Carotid artery duplex 01/03/2020:  Stenosis in the right internal carotid artery (50-69%).  Stenosis in the left common carotid artery (<50%).  Antegrade right vertebral artery flow. Antegrade left vertebral artery  flow.  Follow up  in six months is appropriate if clinically indicated.  Compared to 01/02/2019, progressing of disease, but compared to prior  carotid duplex 08/02/2018, no change. recheck in 6 months.   Hospital echocardiogram 11/10/2017:  - Left ventricle: mild inferior wall  hypokinesis. The cavity size  was normal. Systolic function was normal. The estimated ejection  fraction was in the range of 50% to 55%. Wall motion was normal;  there were no regional wall motion abnormalities. Left  ventricular diastolic function parameters were normal. - Left atrium: The atrium was mildly dilated. - Right ventricle: The cavity size was mildly dilated. Wall  thickness was normal. - Right atrium: The atrium was mildly dilated. - Atrial septum: No defect or patent foramen ovale was identified.  Recent labs: 11/21/2020: Glucose 126, BUN/Cr 25/1.2. EGFR 57.  HbA1C 6.0% Chol 170, TG 137, HDL 56, LDL 90 TSH 1.4 normal  09/27/2019: Glucose 130, BUN/Cr 39/1.21. EGFR 62. Na/K 138/5.4.  H/H 12.7/39.5. MCV 94. Platelets 262 HbA1C ?6% Chol 229, TG 118, HDL 71, LDL 137  Review of Systems  Cardiovascular: Negative for chest pain, dyspnea on exertion, leg swelling, palpitations and syncope.    Vitals:   05/06/21 1037  BP: 123/80  Pulse: (!) 120  Temp: 98.2 F (36.8 C)  SpO2: 97%     Objective:   Physical Exam Cardiovascular:     Rate and Rhythm: Normal rate and regular rhythm.     Pulses: Intact distal pulses.          Carotid pulses are 2+ on the right side with bruit and 2+ on the left side with bruit.      Femoral pulses are 2+ on the right side and 2+ on the left side.      Popliteal pulses are 1+ on the right side and 1+ on the left side.       Dorsalis pedis pulses are 1+ on the right side and 1+ on the left side.       Posterior tibial pulses are 1+ on the right side and 0 on the left side.     Heart sounds: Normal heart sounds. No murmur heard. No gallop.      Comments: No leg edema, no JVD. Pulmonary:     Effort: Pulmonary effort is normal.     Breath sounds: Normal breath sounds.  Abdominal:     General: Bowel sounds are normal.     Palpations: Abdomen is soft.       Assessment & Recommendations:   70 year old Caucasian male with  hypertension, mixed hyperlipidemia, AAA, asymptomatic mild carotid artery stenosis  AAA: Stable 3.1 cm aortic arch and 4.8 cm juxtarenal/infrarenal aortic aneurysms. 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 Possible renal artery stenosis. Patient is followed by Dr. Oneida Alar for the above. He is tolerating losartan 25 mg with stable renal function. Okay to continue./ Switched amlodipine 5 mg to atenolol 50 mg to achieve better heart rate control.  Carotid artery stenosis: Asymptomatic, moderate right, mild left carotid artery stenosis. Continue surveillance.  Is tolerating Crestor, his lipids are being now managed by PCP but goal LDL <70. Recommend aspirin 81 mg daily.  Se below re: lipid management.  CAD: Coronary atherosclerosis seen on CTA scan. He does not have angina symptoms at this time.  Continue medical managment/risk factor modification.  Mixed hyperlipidemia: LDL down to 90 on Crestor 20 mg twice a week. He is reluctant to take it everyday. Recommend 10 mg everyday.  Pre-op risk stratification: Patient needs colonoscopy for surveillance of his polyps. In spite of his CAD and AAA, he is asymptomatic with good functional capacity. I do not think he needs stress testing prior to colonoscopy. Also, I do not think it will negatively impact his AAA.  CMP, lipid panel in 3 months F/u in 6 months   Nigel Mormon, MD, [facility] 05/05/2021, 9:19 PM Office: [phone]

## 2021-05-06 ENCOUNTER — Ambulatory Visit: Payer: Medicare HMO | Admitting: Cardiology

## 2021-05-06 ENCOUNTER — Encounter: Payer: Self-pay | Admitting: Cardiology

## 2021-05-06 ENCOUNTER — Other Ambulatory Visit: Payer: Self-pay

## 2021-05-06 VITALS — BP 123/80 | HR 120 | Temp 98.2°F | Ht 69.0 in | Wt 157.0 lb

## 2021-05-06 DIAGNOSIS — I714 Abdominal aortic aneurysm, without rupture, unspecified: Secondary | ICD-10-CM

## 2021-05-06 DIAGNOSIS — I6523 Occlusion and stenosis of bilateral carotid arteries: Secondary | ICD-10-CM

## 2021-05-06 DIAGNOSIS — I2510 Atherosclerotic heart disease of native coronary artery without angina pectoris: Secondary | ICD-10-CM | POA: Insufficient documentation

## 2021-05-06 DIAGNOSIS — E782 Mixed hyperlipidemia: Secondary | ICD-10-CM

## 2021-05-06 DIAGNOSIS — I10 Essential (primary) hypertension: Secondary | ICD-10-CM

## 2021-05-06 MED ORDER — ATENOLOL 50 MG PO TABS: 50.0000 mg | ORAL_TABLET | Freq: Two times a day (BID) | ORAL | 3 refills | Status: DC

## 2021-05-06 MED ORDER — ROSUVASTATIN CALCIUM 10 MG PO TABS: 10.0000 mg | ORAL_TABLET | Freq: Every day | ORAL | 3 refills | Status: DC

## 2021-05-07 ENCOUNTER — Ambulatory Visit: Payer: Medicare HMO | Admitting: Vascular Surgery

## 2021-05-07 ENCOUNTER — Encounter: Payer: Self-pay | Admitting: Vascular Surgery

## 2021-05-07 VITALS — BP 115/79 | HR 90 | Temp 98.0°F | Resp 20 | Ht 69.0 in | Wt 156.0 lb

## 2021-05-07 DIAGNOSIS — I716 Thoracoabdominal aortic aneurysm, without rupture, unspecified: Secondary | ICD-10-CM

## 2021-05-07 NOTE — Progress Notes (Signed)
Patient is a 70 year old male who returns for follow-up today.  He was last seen October 2021.  We are following him for a type IV thoracoabdominal aneurysm.  Previous perivisceral segment was around 3 cm diameter and the infrarenal portion was 4.5 cm.  This was on a CT scan from October 2021.  He is on a statin and aspirin.  He recently was changed from Norvasc to atenolol for better heart rate control by his cardiologist.  He also has an asymptomatic moderate internal carotid artery stenosis which is followed by his cardiologist.  He has had no significant abdominal or back pain.  Past Medical History:  Diagnosis Date  . Chronic kidney disease   . Hypercholesteremia     Past Surgical History:  Procedure Laterality Date  . NOSE SURGERY     Current Outpatient Medications on File Prior to Visit  Medication Sig Dispense Refill  . aspirin EC 81 MG tablet Take 1 tablet (81 mg total) by mouth daily. 90 tablet 3  . atenolol (TENORMIN) 50 MG tablet Take 1 tablet (50 mg total) by mouth 2 (two) times daily. 90 tablet 3  . Coenzyme Q10 (CO Q10) 100 MG CAPS Take 1 capsule by mouth daily.    Marland Kitchen losartan (COZAAR) 25 MG tablet Take 1 tablet (25 mg total) by mouth daily. 90 tablet 0  . Multiple Vitamins-Minerals (MULTIVITAMIN WITH MINERALS) tablet Take 1 tablet by mouth daily.    . rosuvastatin (CRESTOR) 10 MG tablet Take 1 tablet (10 mg total) by mouth daily. 90 tablet 3   No current facility-administered medications on file prior to visit.    Review of systems: He has no shortness of breath.  He has no chest pain.  Physical exam:  Vitals:   05/07/21 1410  BP: 115/79  Pulse: 90  Resp: 20  Temp: 98 F (36.7 C)  SpO2: 95%  Weight: 156 lb (70.8 kg)  Height: 5\' 9"  (1.753 m)    Abdomen: Soft nontender nondistended palpable pulsatile mass in the epigastrium  Extremities: 2+ femoral pulses 2+ dorsalis pedis pulses  Data: I reviewed the patient's CT scan of the abdomen and pelvis from May 05, 2021.  This shows the juxtarenal portion of his thoracoabdominal aneurysm has dilated from 4.5 to 4.8 cm.  The aortic diameter at the level of the superior mesenteric artery is 2.7 cm.  At the level of the celiac it is 3.2 cm.  At the level of the renal arteries it is 4 cm.  Of note he also has a 3.1 cm dilation of his aortic arch.  This is unchanged.  Assessment: Type IV thoracoabdominal aneurysm slight growth over the last 6 months from 4.5 to 4.8 cm at the infrarenal segment.  Currently asymptomatic.  I discussed with patient today going to an ultrasound every 6 months to examine the infrarenal segment and then a CT scan of the chest once yearly so that we can continue to follow the arch and perivisceral segment of the aorta.  This will minimize his radiation exposure and also keep good surveillance on the infrarenal portion.  We discussed that at some point this may require repair and it would be potentially with a fenestrated stent graft or with open aortic aneurysm repair.  We also discussed today the risk factors for growth over time which would be poorly controlled hypertension or severe coughing related to COPD.  Patient is also scheduled for colonoscopy in the near future.  I discussed with him that I  did not think this would have any bearing on his aneurysm.  He should proceed with colonoscopy as usual.  Plan: Patient will have a follow-up ultrasound of his aortic aneurysm in 6 months and be seen in our APP clinic.  He needs to have a yearly CT angio of the chest abdomen and pelvis so that we can continue to follow the thoracic and perivisceral segment.  We will schedule this after his 68-month appointment.  Ruta Hinds, MD [facility] Office: [phone]

## 2021-05-08 DIAGNOSIS — I10 Essential (primary) hypertension: Secondary | ICD-10-CM

## 2021-05-08 DIAGNOSIS — I714 Abdominal aortic aneurysm, without rupture, unspecified: Secondary | ICD-10-CM

## 2021-05-08 MED ORDER — ATENOLOL 50 MG PO TABS: 25.0000 mg | ORAL_TABLET | Freq: Two times a day (BID) | ORAL | 0 refills | Status: DC

## 2021-05-08 NOTE — Telephone Encounter (Signed)
Recommend atenolol 25 mg (1/2 of 50) once a day. Spoke to the patient.   Nigel Mormon, MD Pager: [phone] Office: [phone]

## 2021-05-08 NOTE — Telephone Encounter (Signed)
From patient.

## 2021-05-12 ENCOUNTER — Other Ambulatory Visit: Payer: Self-pay

## 2021-05-12 ENCOUNTER — Other Ambulatory Visit: Payer: Self-pay | Admitting: *Deleted

## 2021-05-12 DIAGNOSIS — I716 Thoracoabdominal aortic aneurysm, without rupture, unspecified: Secondary | ICD-10-CM

## 2021-06-02 ENCOUNTER — Other Ambulatory Visit: Payer: Self-pay

## 2021-06-02 MED ORDER — LOSARTAN POTASSIUM 25 MG PO TABS: 25.0000 mg | ORAL_TABLET | Freq: Every day | ORAL | 1 refills | Status: DC

## 2021-08-17 DIAGNOSIS — L821 Other seborrheic keratosis: Secondary | ICD-10-CM | POA: Diagnosis not present

## 2021-08-17 DIAGNOSIS — Z85828 Personal history of other malignant neoplasm of skin: Secondary | ICD-10-CM | POA: Diagnosis not present

## 2021-08-17 DIAGNOSIS — L570 Actinic keratosis: Secondary | ICD-10-CM | POA: Diagnosis not present

## 2021-08-17 DIAGNOSIS — D692 Other nonthrombocytopenic purpura: Secondary | ICD-10-CM | POA: Diagnosis not present

## 2021-08-20 DIAGNOSIS — I6523 Occlusion and stenosis of bilateral carotid arteries: Secondary | ICD-10-CM | POA: Diagnosis not present

## 2021-08-20 DIAGNOSIS — Z0000 Encounter for general adult medical examination without abnormal findings: Secondary | ICD-10-CM | POA: Diagnosis not present

## 2021-08-20 DIAGNOSIS — Z1211 Encounter for screening for malignant neoplasm of colon: Secondary | ICD-10-CM | POA: Diagnosis not present

## 2021-08-20 DIAGNOSIS — R7301 Impaired fasting glucose: Secondary | ICD-10-CM | POA: Diagnosis not present

## 2021-08-20 DIAGNOSIS — Z125 Encounter for screening for malignant neoplasm of prostate: Secondary | ICD-10-CM | POA: Diagnosis not present

## 2021-08-20 DIAGNOSIS — Z1389 Encounter for screening for other disorder: Secondary | ICD-10-CM | POA: Diagnosis not present

## 2021-08-20 DIAGNOSIS — R7989 Other specified abnormal findings of blood chemistry: Secondary | ICD-10-CM | POA: Diagnosis not present

## 2021-08-20 DIAGNOSIS — E7800 Pure hypercholesterolemia, unspecified: Secondary | ICD-10-CM | POA: Diagnosis not present

## 2021-08-20 DIAGNOSIS — I714 Abdominal aortic aneurysm, without rupture: Secondary | ICD-10-CM | POA: Diagnosis not present

## 2021-08-24 ENCOUNTER — Other Ambulatory Visit: Payer: Self-pay

## 2021-08-24 IMAGING — CT CT CTA ABD/PEL W/CM AND/OR W/O CM
1 of 2 series · 13 of 32 positions shown, 17 images · IV contrast (iopamidol)
Comparison: 09/29/2020 and previous

CLINICAL DATA: Aortic aneurysm surveillance, asymptomatic

EXAM:
CT ANGIOGRAPHY CHEST, ABDOMEN AND PELVIS
TECHNIQUE: Multidetector CT imaging through the chest, abdomen and pelvis was
performed using the standard protocol during bolus administration of
intravenous contrast. Multiplanar reconstructed images and MIPs were
obtained and reviewed to evaluate the vascular anatomy.
CONTRAST:  75mL SVQ3S6-Z2R IOPAMIDOL ([id number]) INJECTION 76%

[Series 4: cta cap non-dissection/pre-stent · axial · non-contrast · 0.76mm/px · z∈[-581,-32]mm · 13 of 207 slices shown, 17 images]
[im 16/207  soft-tissue]
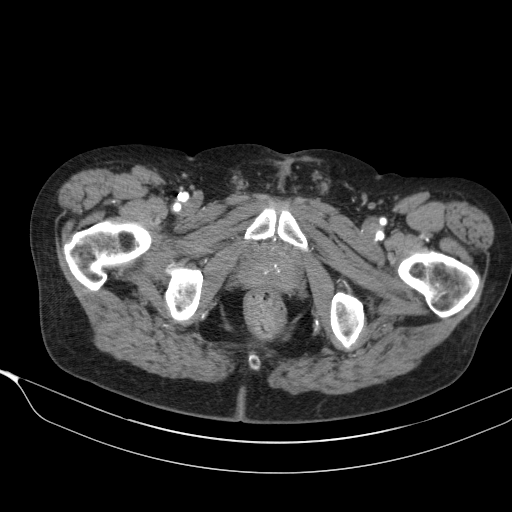
[im 16/207  bone]
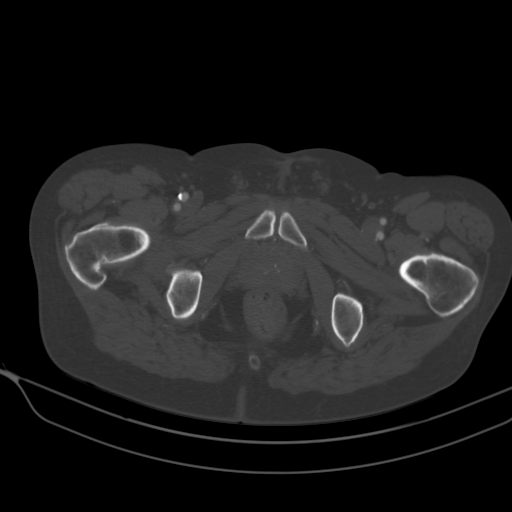
[im 31/207  soft-tissue]
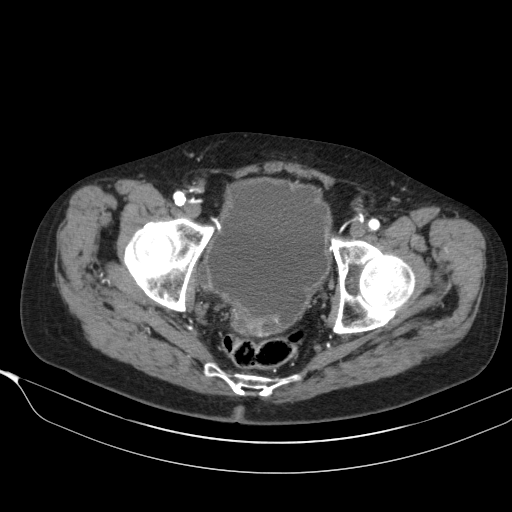
[im 46/207  soft-tissue]
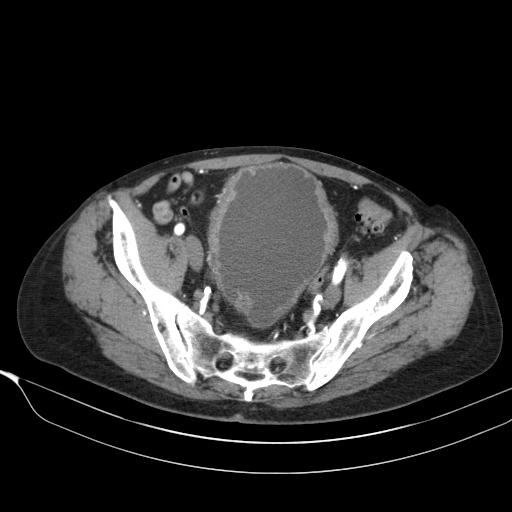
[im 69/207  soft-tissue]
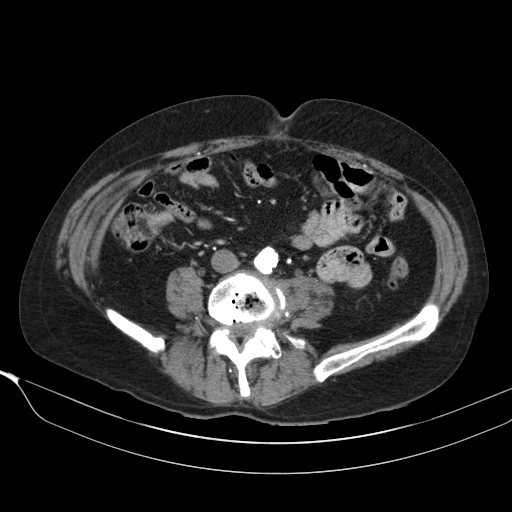
[im 84/207  soft-tissue]
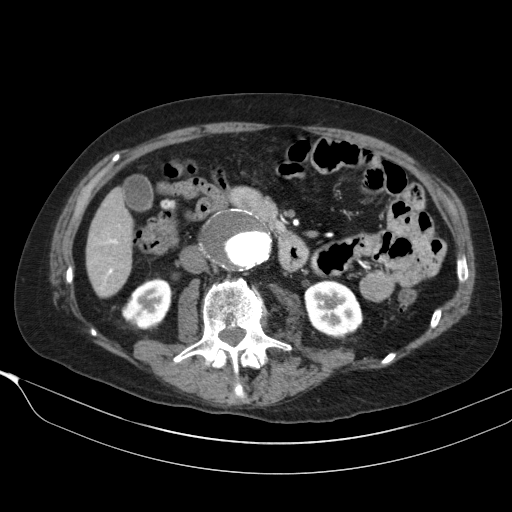
[im 107/207  soft-tissue]
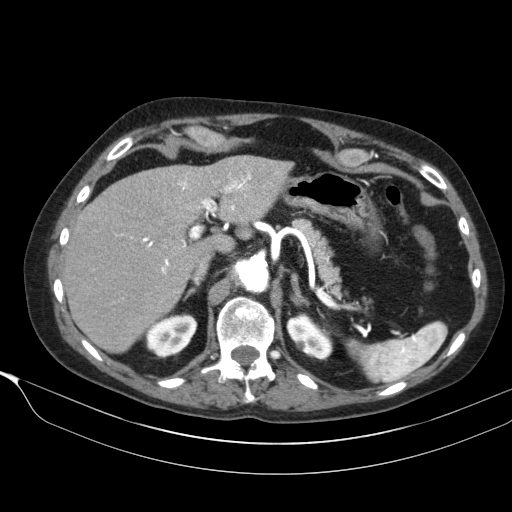
[im 123/207  soft-tissue]
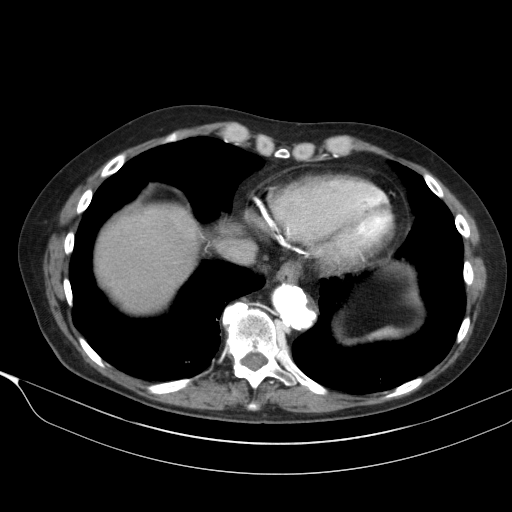
[im 138/207  soft-tissue]
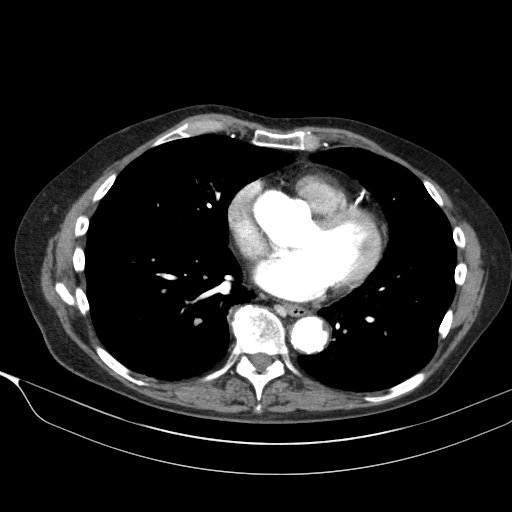
[im 161/207  soft-tissue]
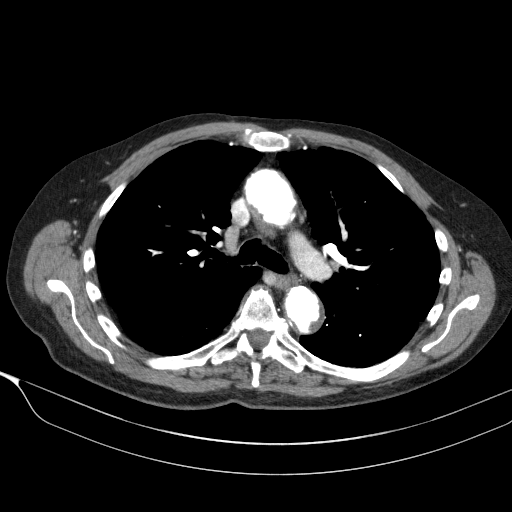
[im 161/207  bone]
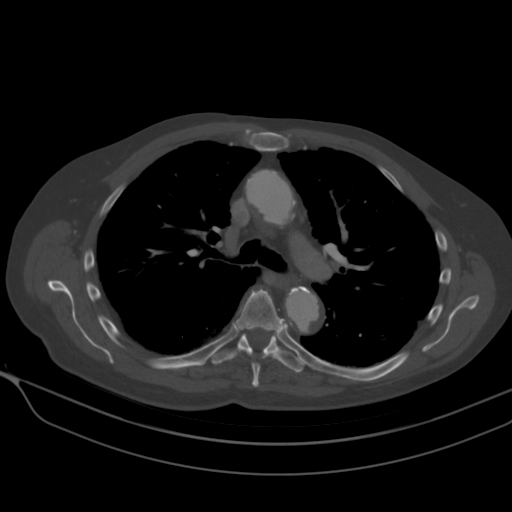
[im 176/207  soft-tissue]
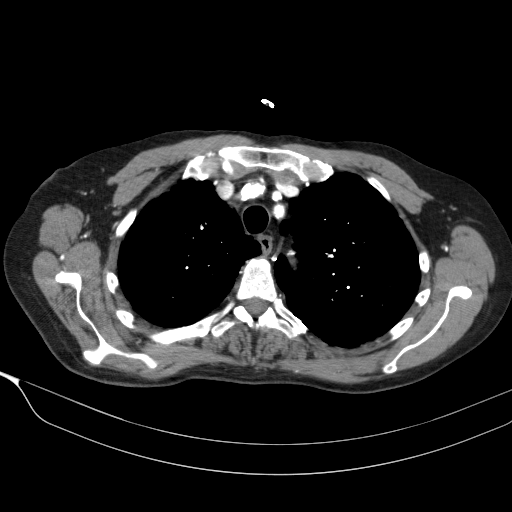
[im 176/207  lung]
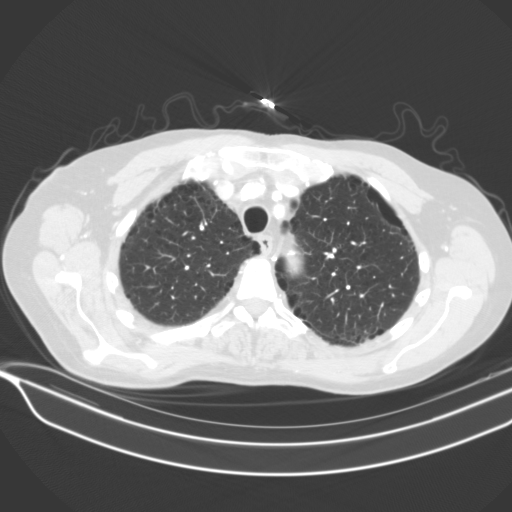
[im 184/207  lung]
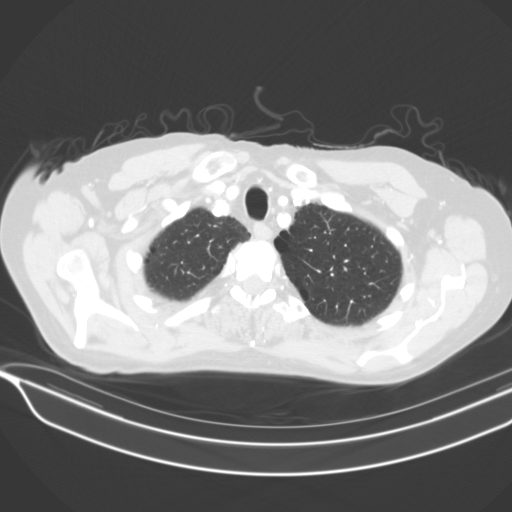
[im 191/207  soft-tissue]
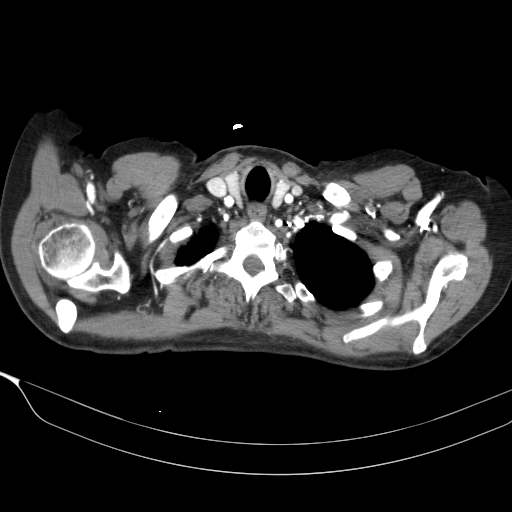
[im 191/207  lung]
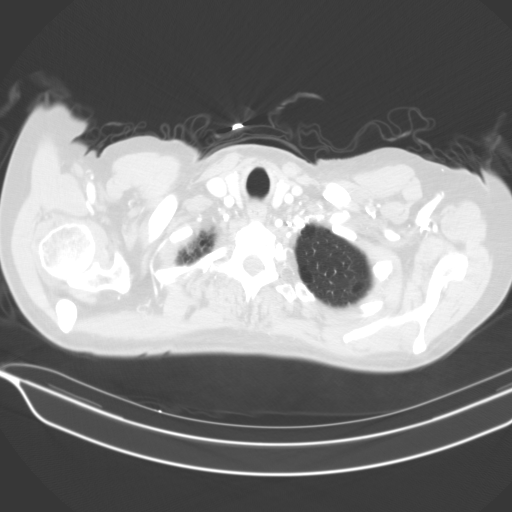
[im 199/207  lung]
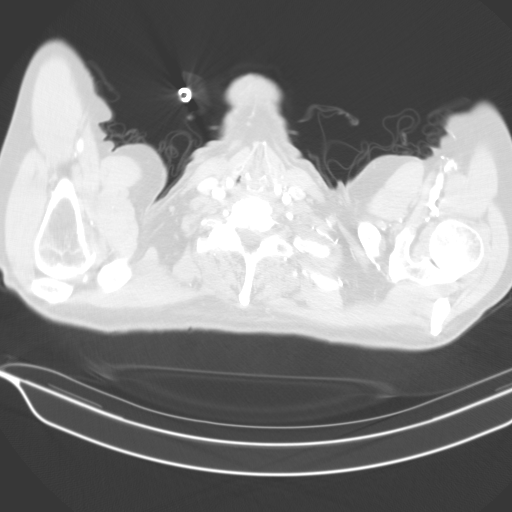

[13 of 32 positions shown; findings below may reference images not displayed]

FINDINGS: CTA CHEST FINDINGS

Cardiovascular: Heart size normal. No pericardial effusion.
Incomplete opacification of the pulmonary arterial tree; the exam
was not optimized for detection of pulmonary emboli. 3-vessel
coronary calcifications.

Aortic Root:

--Valve: 2.6 cm

--Sinuses: 4.3 cm

--Sinotubular Junction: 3.2 cm

Limitations by motion: Moderate

Thoracic Aorta:

--Ascending Aorta: 3.7 cm

--Aortic Arch: 3.1 cm (stable by my measurement)

--Descending Aorta: 3.1 cm

Classic 3-vessel brachiocephalic arterial origin anatomy. Partially
calcified plaque resulting in short segment stenosis at the left
common carotid origin, of at least moderate severity. Extensive
partially calcified plaque in the arch and descending thoracic
segment with scattered eccentric nonocclusive mural thrombus.

Mediastinum/Nodes: No mass or adenopathy.

Lungs/Pleura: No pleural effusion. Emphysema with multiple
subpleural blebs. No nodule or airspace consolidation.

Musculoskeletal: Anterior vertebral endplate spurring at multiple
levels in the lower thoracic spine.

Review of the MIP images confirms the above findings.

CTA ABDOMEN AND PELVIS FINDINGS

VASCULAR

Aorta: Moderate tortuosity. Fusiform juxtarenal/infrarenal aneurysm
4.8 cm maximum transverse diameter (previously 4.7), terminating
above the bifurcation. There is eccentric nonocclusive mural
thrombus in the aneurysmal segment. No dissection or stenosis.

Celiac: Patent without evidence of aneurysm, dissection, vasculitis
or significant stenosis.

SMA: Patent without evidence of aneurysm, dissection, vasculitis or
significant stenosis.

Renals: Single right, with calcified ostial plaque resulting in only
mild stenosis, patent distally. Single left, with calcified ostial
plaque extending over length of approximately 1.6 cm resulting in at
least moderate short-segment stenosis, patent distally.

IMA: Patent, arising below the aneurysmal segment.

Inflow: Calcified plaque throughout the common iliac arteries,
measuring 1.6 cm diameter right, 1.3 cm left. 1.5 cm saccular
aneurysm of left internal iliac artery. Scattered plaque in
bilateral external iliac arteries without aneurysm or stenosis.

Veins: Patent hepatic veins, portal vein, SMV, splenic vein,
bilateral renal veins, IVC. No venous pathology identified.

Review of the MIP images confirms the above findings.

NON-VASCULAR

Hepatobiliary: No focal liver abnormality is seen. No gallstones,
gallbladder wall thickening, or biliary dilatation.

Pancreas: Unremarkable. No pancreatic ductal dilatation or
surrounding inflammatory changes.

Spleen: Normal in size without focal abnormality.

Adrenals/Urinary Tract: Adrenal glands and kidneys unremarkable.
Irregular bladder wall thickening throughout with multiple small
diverticula.

Stomach/Bowel: Stomach and small bowel decompressed. Normal
appendix. The colon is nondilated with scattered diverticula most
numerous at the descending/sigmoid junction; no adjacent
inflammatory change.

Lymphatic: No abdominal or pelvic adenopathy.

Reproductive: Prostate enlargement with central coarse
calcifications.

Other: No ascites.  No free air.

Musculoskeletal: Mild lumbar dextroscoliosis apex L3 without
underlying vertebral anomaly, multilevel spondylitic changes. No
fracture or worrisome bone lesion.

Review of the MIP images confirms the above findings.
IMPRESSION: 1. Stable 3.1 cm aortic arch and 4.8 cm juxtarenal/infrarenal aortic
aneurysms. Recommend follow-up CT/MR every 6 months (the patient is
currently under the care of a vascular specialist/surgeon).
2. 1.5 cm left internal iliac artery aneurysm, previously 1.3.
3. Left renal artery stenosis of possible hemodynamic significance.
4. Coronary and Aortic Atherosclerosis (39MO0-170.0).
5.  Emphysema (39MO0-V2B.R).
6. Prostate enlargement with trabeculated bladder suggesting a
degree of bladder outlet obstruction.
7. Colonic diverticulosis.

## 2021-08-24 IMAGING — CT CT ANGIO CHEST
1 of 2 series · 17 of 32 positions shown · IV contrast (APPLIED)
Comparison: 09/29/2020 and previous

CLINICAL DATA: Aortic aneurysm surveillance, asymptomatic

EXAM:
CT ANGIOGRAPHY CHEST, ABDOMEN AND PELVIS
TECHNIQUE: Multidetector CT imaging through the chest, abdomen and pelvis was
performed using the standard protocol during bolus administration of
intravenous contrast. Multiplanar reconstructed images and MIPs were
obtained and reviewed to evaluate the vascular anatomy.
CONTRAST:  75mL SVQ3S6-Z2R IOPAMIDOL (SVQ3S6-Z2R) INJECTION 76%

[Series 4: cta cap non-dissection/pre-stent · axial · non-contrast · 0.76mm/px · z∈[-605,-32]mm · 17 of 207 slices shown]
[im 8/207  lung]
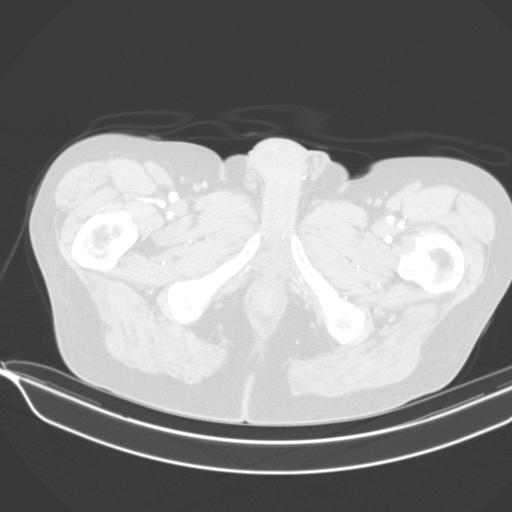
[im 23/207  soft-tissue]
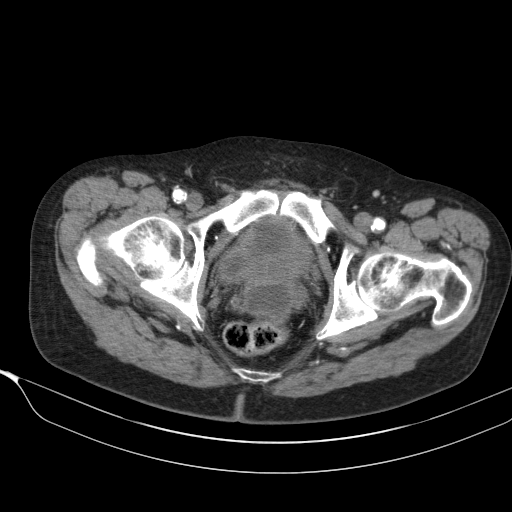
[im 31/207  lung]
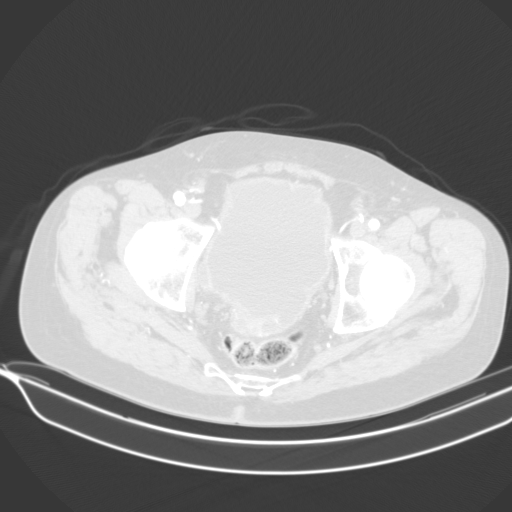
[im 46/207  soft-tissue]
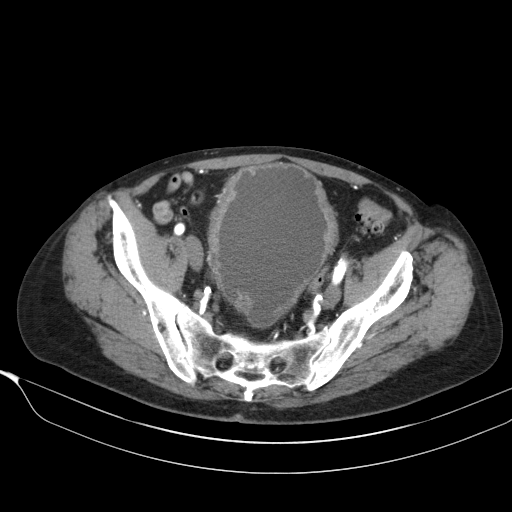
[im 54/207  lung]
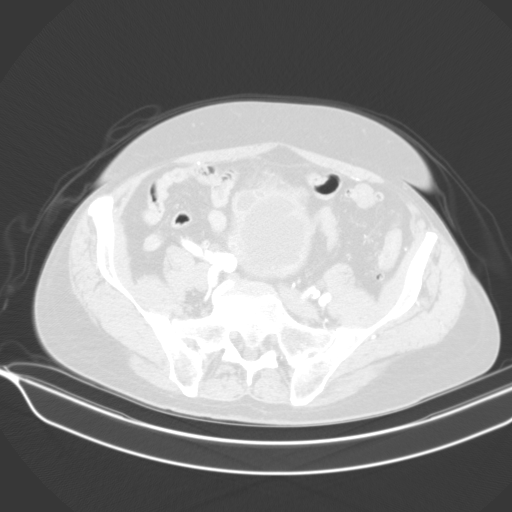
[im 69/207  soft-tissue]
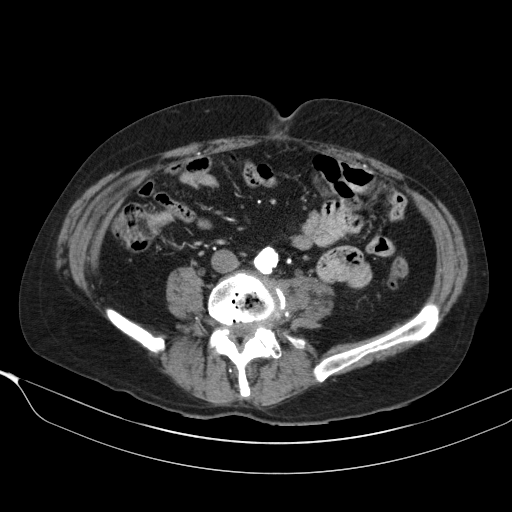
[im 77/207  lung]
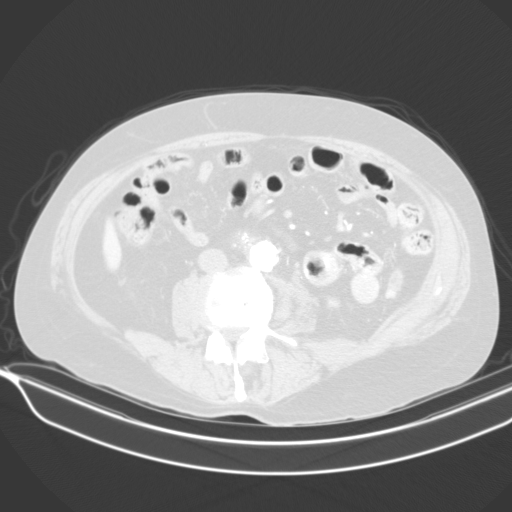
[im 92/207  soft-tissue]
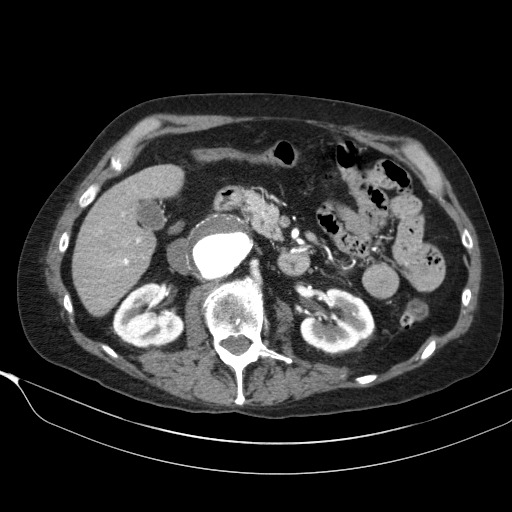
[im 107/207  lung]
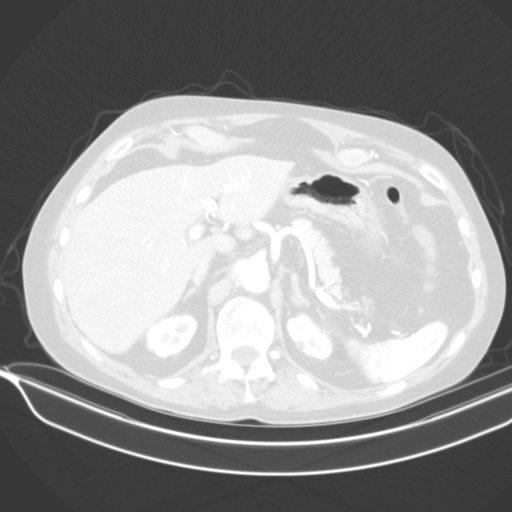
[im 115/207  soft-tissue]
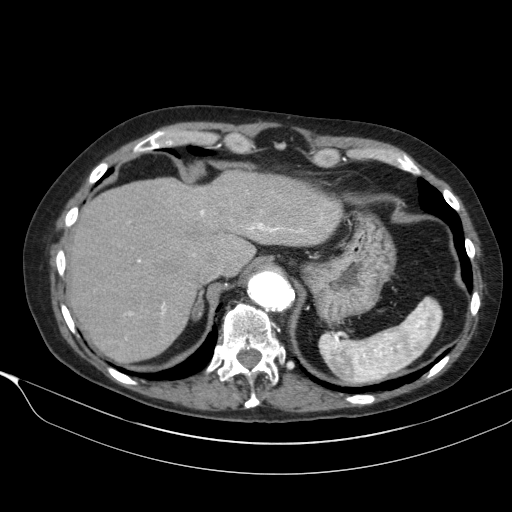
[im 130/207  lung]
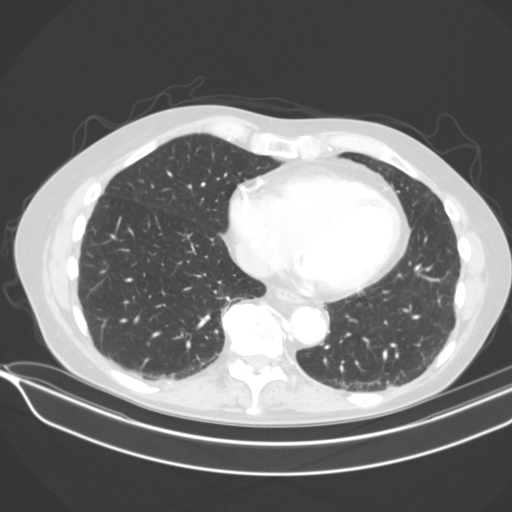
[im 138/207  soft-tissue]
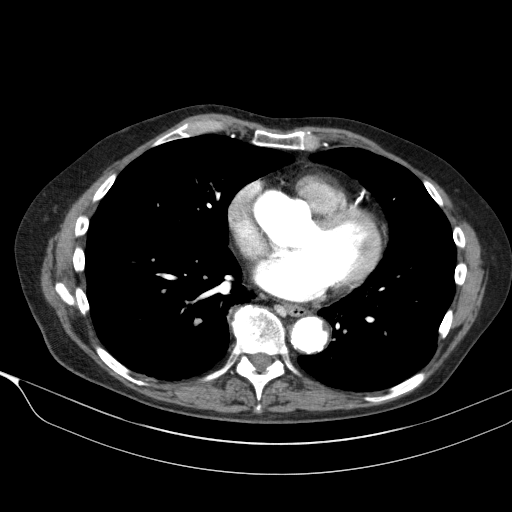
[im 153/207  lung]
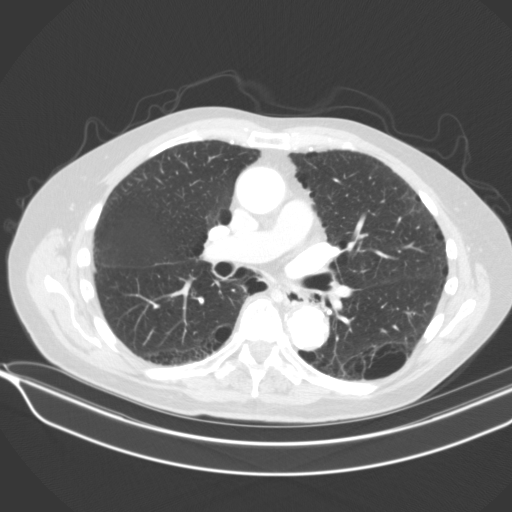
[im 161/207  soft-tissue]
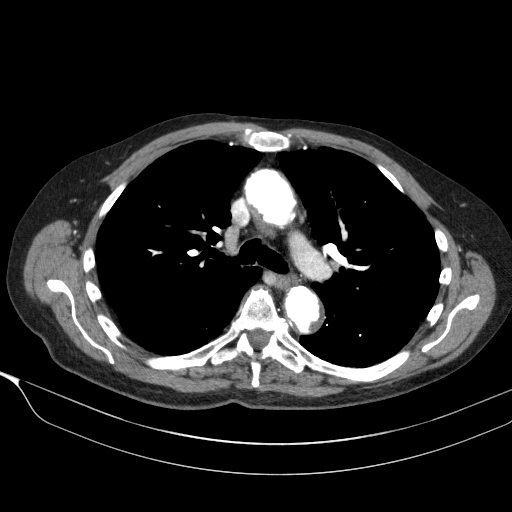
[im 176/207  lung]
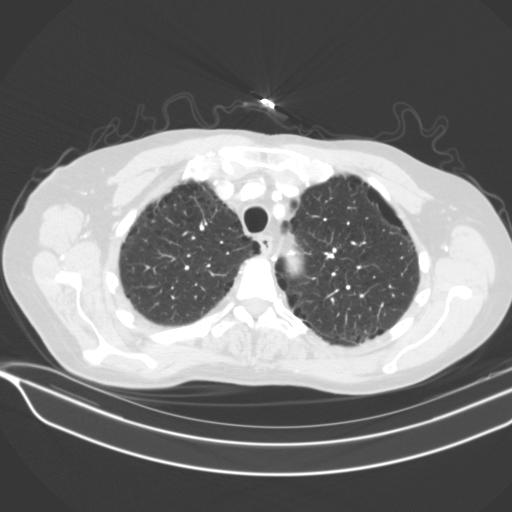
[im 184/207  soft-tissue]
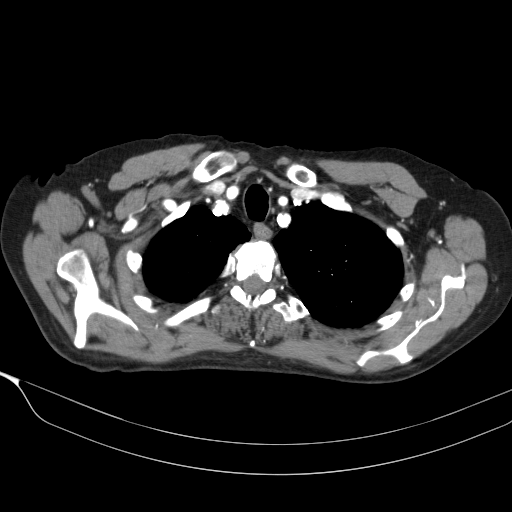
[im 199/207  lung]
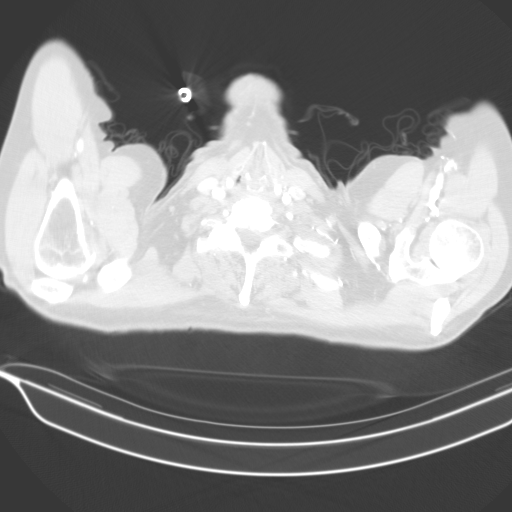

[17 of 32 positions shown; findings below may reference images not displayed]

FINDINGS: CTA CHEST FINDINGS

Cardiovascular: Heart size normal. No pericardial effusion.
Incomplete opacification of the pulmonary arterial tree; the exam
was not optimized for detection of pulmonary emboli. 3-vessel
coronary calcifications.

Aortic Root:

--Valve: 2.6 cm

--Sinuses: 4.3 cm

--Sinotubular Junction: 3.2 cm

Limitations by motion: Moderate

Thoracic Aorta:

--Ascending Aorta: 3.7 cm

--Aortic Arch: 3.1 cm (stable by my measurement)

--Descending Aorta: 3.1 cm

Classic 3-vessel brachiocephalic arterial origin anatomy. Partially
calcified plaque resulting in short segment stenosis at the left
common carotid origin, of at least moderate severity. Extensive
partially calcified plaque in the arch and descending thoracic
segment with scattered eccentric nonocclusive mural thrombus.

Mediastinum/Nodes: No mass or adenopathy.

Lungs/Pleura: No pleural effusion. Emphysema with multiple
subpleural blebs. No nodule or airspace consolidation.

Musculoskeletal: Anterior vertebral endplate spurring at multiple
levels in the lower thoracic spine.

Review of the MIP images confirms the above findings.

CTA ABDOMEN AND PELVIS FINDINGS

VASCULAR

Aorta: Moderate tortuosity. Fusiform juxtarenal/infrarenal aneurysm
4.8 cm maximum transverse diameter (previously 4.7), terminating
above the bifurcation. There is eccentric nonocclusive mural
thrombus in the aneurysmal segment. No dissection or stenosis.

Celiac: Patent without evidence of aneurysm, dissection, vasculitis
or significant stenosis.

SMA: Patent without evidence of aneurysm, dissection, vasculitis or
significant stenosis.

Renals: Single right, with calcified ostial plaque resulting in only
mild stenosis, patent distally. Single left, with calcified ostial
plaque extending over length of approximately 1.6 cm resulting in at
least moderate short-segment stenosis, patent distally.

IMA: Patent, arising below the aneurysmal segment.

Inflow: Calcified plaque throughout the common iliac arteries,
measuring 1.6 cm diameter right, 1.3 cm left. 1.5 cm saccular
aneurysm of left internal iliac artery. Scattered plaque in
bilateral external iliac arteries without aneurysm or stenosis.

Veins: Patent hepatic veins, portal vein, SMV, splenic vein,
bilateral renal veins, IVC. No venous pathology identified.

Review of the MIP images confirms the above findings.

NON-VASCULAR

Hepatobiliary: No focal liver abnormality is seen. No gallstones,
gallbladder wall thickening, or biliary dilatation.

Pancreas: Unremarkable. No pancreatic ductal dilatation or
surrounding inflammatory changes.

Spleen: Normal in size without focal abnormality.

Adrenals/Urinary Tract: Adrenal glands and kidneys unremarkable.
Irregular bladder wall thickening throughout with multiple small
diverticula.

Stomach/Bowel: Stomach and small bowel decompressed. Normal
appendix. The colon is nondilated with scattered diverticula most
numerous at the descending/sigmoid junction; no adjacent
inflammatory change.

Lymphatic: No abdominal or pelvic adenopathy.

Reproductive: Prostate enlargement with central coarse
calcifications.

Other: No ascites.  No free air.

Musculoskeletal: Mild lumbar dextroscoliosis apex L3 without
underlying vertebral anomaly, multilevel spondylitic changes. No
fracture or worrisome bone lesion.

Review of the MIP images confirms the above findings.
IMPRESSION: 1. Stable 3.1 cm aortic arch and 4.8 cm juxtarenal/infrarenal aortic
aneurysms. Recommend follow-up CT/MR every 6 months (the patient is
currently under the care of a vascular specialist/surgeon).
2. 1.5 cm left internal iliac artery aneurysm, previously 1.3.
3. Left renal artery stenosis of possible hemodynamic significance.
4. Coronary and Aortic Atherosclerosis (39MO0-170.0).
5.  Emphysema ([id number]-V2B.R).
6. Prostate enlargement with trabeculated bladder suggesting a
degree of bladder outlet obstruction.
7. Colonic diverticulosis.

## 2021-08-24 MED ORDER — ATENOLOL 25 MG PO TABS: 25.0000 mg | ORAL_TABLET | Freq: Every day | ORAL | 0 refills | Status: DC

## 2021-08-27 NOTE — Progress Notes (Signed)
Labs 08/20/2021:  Total cholesterol 178, triglycerides 166, HDL 50, LDL 99, non-HDL cholesterol 128.  A1c 6.1%.  PSA normal.  Hb 13.8/HCT 41.1, platelets 286, normal indicis.  Serum glucose '1 1 5 ' mg, BUN 28, creatinine 1.13, EGFR 70 mL, potassium 5.1, CMP otherwise normal.

## 2021-09-10 DIAGNOSIS — H9202 Otalgia, left ear: Secondary | ICD-10-CM | POA: Diagnosis not present

## 2021-09-10 DIAGNOSIS — H6122 Impacted cerumen, left ear: Secondary | ICD-10-CM | POA: Diagnosis not present

## 2021-09-11 DIAGNOSIS — H60509 Unspecified acute noninfective otitis externa, unspecified ear: Secondary | ICD-10-CM | POA: Diagnosis not present

## 2021-09-28 NOTE — Telephone Encounter (Signed)
From patient.

## 2021-09-29 NOTE — Telephone Encounter (Signed)
Please obtain additional blood pressure readings. How long has he been having symptoms?

## 2021-10-01 NOTE — Telephone Encounter (Signed)
From pt

## 2021-10-01 NOTE — Telephone Encounter (Signed)
I don't think symptoms are related to blood pressure. Consider checking with PCP.  Thanks MJP

## 2021-10-08 NOTE — Telephone Encounter (Signed)
From pt

## 2021-10-08 NOTE — Telephone Encounter (Signed)
Please schedule

## 2021-10-08 NOTE — Telephone Encounter (Signed)
Low but not concerning if no symptoms of dizziness. Hold amlodipine if SBP<100 mmHg. Increase hydration. Can make appt at next available with me or CC.  Thanks MJP

## 2021-10-08 NOTE — Telephone Encounter (Signed)
Ok. Can make appt tomorrow if you would like to be seen.  10/08/21

## 2021-10-15 ENCOUNTER — Telehealth: Payer: Self-pay

## 2021-10-15 NOTE — Telephone Encounter (Signed)
Patient's wife called to ask if it was okay that patient got a flu shot with a history of AAA. Advised this was fine and also encouraged. She verbalized understanding.

## 2021-10-23 NOTE — Telephone Encounter (Signed)
From pt

## 2021-10-23 NOTE — Telephone Encounter (Signed)
FROM PT

## 2021-10-26 NOTE — Telephone Encounter (Signed)
From pt

## 2021-10-28 DIAGNOSIS — N390 Urinary tract infection, site not specified: Secondary | ICD-10-CM | POA: Diagnosis not present

## 2021-10-28 DIAGNOSIS — I714 Abdominal aortic aneurysm, without rupture, unspecified: Secondary | ICD-10-CM | POA: Diagnosis not present

## 2021-11-04 DIAGNOSIS — R338 Other retention of urine: Secondary | ICD-10-CM | POA: Diagnosis not present

## 2021-11-04 DIAGNOSIS — N401 Enlarged prostate with lower urinary tract symptoms: Secondary | ICD-10-CM | POA: Diagnosis not present

## 2021-11-05 ENCOUNTER — Encounter: Payer: Self-pay | Admitting: Cardiology

## 2021-11-05 ENCOUNTER — Ambulatory Visit: Payer: Managed Care, Other (non HMO) | Admitting: Cardiology

## 2021-11-05 ENCOUNTER — Other Ambulatory Visit: Payer: Self-pay

## 2021-11-05 VITALS — BP 97/58 | HR 81 | Temp 97.8°F | Resp 17 | Ht 69.0 in | Wt 144.6 lb

## 2021-11-05 DIAGNOSIS — I739 Peripheral vascular disease, unspecified: Secondary | ICD-10-CM | POA: Insufficient documentation

## 2021-11-05 DIAGNOSIS — I6523 Occlusion and stenosis of bilateral carotid arteries: Secondary | ICD-10-CM

## 2021-11-05 DIAGNOSIS — I701 Atherosclerosis of renal artery: Secondary | ICD-10-CM | POA: Diagnosis not present

## 2021-11-05 DIAGNOSIS — I7143 Infrarenal abdominal aortic aneurysm, without rupture: Secondary | ICD-10-CM

## 2021-11-05 DIAGNOSIS — E782 Mixed hyperlipidemia: Secondary | ICD-10-CM | POA: Diagnosis not present

## 2021-11-05 DIAGNOSIS — R9431 Abnormal electrocardiogram [ECG] [EKG]: Secondary | ICD-10-CM

## 2021-11-05 MED ORDER — ROSUVASTATIN CALCIUM 20 MG PO TABS: 20.0000 mg | ORAL_TABLET | Freq: Every day | ORAL | 3 refills | Status: DC

## 2021-11-05 NOTE — Progress Notes (Signed)
 Primary Physician/Referring:  Wong, Francis P, MD  Patient ID: Ian [name], male    DOB: 03/07/1951, 70 y.o.   MRN: [id number]  Chief Complaint  Patient presents with   AAA   Coronary Artery Disease   Follow-up    6 months   HPI:    Ian [name]  is a 70 y.o. Caucasian male with asymptomatic carotid artery stenosis, hypertension, hyperlipidemia and chronic palpitations. He was last seen by us in 2019.  He now presents for follow-up of carotid artery disease.  His main complaint today is severe left leg pain even with minimal activity and he has reduced his physical activity due to this.  He thinks it is his left knee arthritis.  He has remained abstinent from tobacco.  He denies chest pain or dyspnea but he states that his activity has been markedly limited.  His other complaint is marked dizziness and states that he had held his atenolol and had felt well and is wondering whether he can stop the medication.  Past Medical History:  Diagnosis Date   Chronic kidney disease    Hypercholesteremia    Past Surgical History:  Procedure Laterality Date   NOSE SURGERY     Family History  Problem Relation Age of Onset   Colon cancer Mother    Bladder Cancer Father     Social History   Tobacco Use   Smoking status: Former    Packs/day: 0.50    Years: 40.00    Pack years: 20.00    Types: Cigarettes    Quit date: 2015    Years since quitting: 7.9   Smokeless tobacco: Never  Substance Use Topics   Alcohol use: Yes    Comment: occ   Marital Status: Married  ROS  Review of Systems  Eyes:  Negative for blurred vision and double vision.  Cardiovascular:  Positive for claudication (left leg). Negative for chest pain, dyspnea on exertion and leg swelling.  Gastrointestinal:  Negative for melena.  Genitourinary:  Positive for hesitancy (presently has indwelling catheter).  Objective  Blood pressure (!) 97/58, pulse 81, temperature 97.8 F (36.6 C), temperature  source Temporal, resp. rate 17, height 5' 9" (1.753 m), weight 144 lb 9.6 oz (65.6 kg), SpO2 98 %. Body mass index is 21.35 kg/m.  Vitals with BMI 11/05/2021 05/07/2021 05/06/2021  Height 5' 9" 5' 9" 5' 9"  Weight 144 lbs 10 oz 156 lbs 157 lbs  BMI 21.34 23.03 23.17  Systolic 97 115 123  Diastolic 58 79 80  Pulse 81 90 120    Physical Exam Neck:     Vascular: Carotid bruit (left) present. No JVD.  Cardiovascular:     Rate and Rhythm: Normal rate and regular rhythm.     Pulses: Intact distal pulses.          Femoral pulses are 2+ on the right side and 0 on the left side.      Popliteal pulses are 2+ on the right side and 0 on the left side.       Dorsalis pedis pulses are 1+ on the right side and 0 on the left side.       Posterior tibial pulses are 0 on the right side and 0 on the left side.     Heart sounds: Normal heart sounds. No murmur heard.   No gallop.  Pulmonary:     Effort: Pulmonary effort is normal.     Breath sounds: Normal breath sounds.    Abdominal:     General: Bowel sounds are normal.     Palpations: Abdomen is soft.  Musculoskeletal:     Right lower leg: No edema.     Left lower leg: No edema.     Comments: Left leg cooler. Hair loss bilateral legs. Capillary refill reduced to > 3 Sec left leg.     Laboratory examination:   No results for input(s): NA, K, CL, CO2, GLUCOSE, BUN, CREATININE, CALCIUM, GFRNONAA, GFRAA in the last 8760 hours. CrCl cannot be calculated (Patient's most recent lab result is older than the maximum 21 days allowed.).  CMP Latest Ref Rng & Units 09/27/2019 09/17/2019 09/05/2019  Glucose 65 - 99 mg/dL 130(H) 128(H) 172(H)  BUN 8 - 27 mg/dL 39(H) 33(H) 30(H)  Creatinine 0.76 - 1.27 mg/dL 1.21 1.15 1.35(H)  Sodium 134 - 144 mmol/L 138 140 141  Potassium 3.5 - 5.2 mmol/L 5.4(H) 5.5(H) 4.5  Chloride 96 - 106 mmol/L 101 101 102  CO2 20 - 29 mmol/L 23 24 21  Calcium 8.6 - 10.2 mg/dL 10.0 10.0 9.7  Total Protein 6.5 - 8.1 g/dL - - -  Total  Bilirubin 0.3 - 1.2 mg/dL - - -  Alkaline Phos 38 - 126 U/L - - -  AST 15 - 41 U/L - - -  ALT 0 - 44 U/L - - -   CBC Latest Ref Rng & Units 07/05/2019  WBC 4.0 - 10.5 K/uL 7.4  Hemoglobin 13.0 - 17.0 g/dL 12.7(L)  Hematocrit 39.0 - 52.0 % 39.5  Platelets 150 - 400 K/uL 262    Lipid Panel No results for input(s): CHOL, TRIG, LDLCALC, VLDL, HDL, CHOLHDL, LDLDIRECT in the last 8760 hours. Lipid Panel     Component Value Date/Time   CHOL 229 (H) 09/27/2019 1051   TRIG 118 09/27/2019 1051   HDL 71 09/27/2019 1051   CHOLHDL 3.2 09/27/2019 1051   LDLCALC 137 (H) 09/27/2019 1051   LABVLDL 21 09/27/2019 1051     HEMOGLOBIN A1C No results found for: HGBA1C, MPG TSH No results for input(s): TSH in the last 8760 hours.  External labs:   Labs 10/28/2021:  WBC 7.9   4.0-11.0 RBC 4.48   4.20-5.80 HGB 13.8   13.0-17.0 HCT 41.1   39.0-52.0 MCV 91.7   80.0-94.0 MCH 30.8   27.0-33.0 MCHC 33.6   32.0-36.0 RDW 14.3   11.5-15.5 PLT 286   150-400  Glucose 105   70-99 BUN 28   6-26 Creatinine 1.13   0.60-1.30 eGFR2021 70   >60 Sodium 138   136-145 Potassium 5.1   3.5-5.5 Protein, Total 7.8   6.0-8.3 Albumin 4.5   3.4-4.8 TBIL 0.4   0.3-1.0 ALP 51   38-126 AST 20   0-39  Labs 08/20/2021:  Total cholesterol 178, triglycerides 166, HDL 50, LDL 99, non-HDL cholesterol 128.  A1c 6.1%.  PSA normal.  Hb 13.8/HCT 41.1, platelets 286, normal indicis.  Serum glucose 1 1 5 mg, BUN 28, creatinine 1.13, EGFR 70 mL, potassium 5.1, CMP otherwise normal. Medications and allergies  No Known Allergies   Medication prior to this encounter:   Outpatient Medications Prior to Visit  Medication Sig Dispense Refill   Ascorbic Acid (VITAMIN C) 1000 MG tablet Take 1 tablet by mouth daily.     aspirin EC 81 MG tablet Take 1 tablet (81 mg total) by mouth daily. 90 tablet 3   cholecalciferol (VITAMIN D3) 25 MCG (1000 UNIT) tablet Take 1 tablet by mouth daily.       Coenzyme Q10 (CO Q10) 100 MG CAPS  Take 1 capsule by mouth daily.     L-Lysine 1000 MG TABS Take 1 tablet by mouth See admin instructions.     losartan (COZAAR) 25 MG tablet Take 1 tablet (25 mg total) by mouth daily. 90 tablet 1   Multiple Vitamins-Minerals (MULTIVITAMIN WITH MINERALS) tablet Take 1 tablet by mouth daily.     tamsulosin (FLOMAX) 0.4 MG CAPS capsule Take 0.4 mg by mouth daily.     atenolol (TENORMIN) 25 MG tablet Take 1 tablet (25 mg total) by mouth daily. 90 tablet 0   rosuvastatin (CRESTOR) 10 MG tablet Take 1 tablet (10 mg total) by mouth daily. 90 tablet 3   ofloxacin (OCUFLOX) 0.3 % ophthalmic solution Place 1 drop into both eyes daily.     sulfamethoxazole-trimethoprim (BACTRIM DS) 800-160 MG tablet Take 1 tablet by mouth 2 (two) times daily.     No facility-administered medications prior to visit.     Medication list after today's encounter   Current Outpatient Medications  Medication Instructions   Ascorbic Acid (VITAMIN C) 1000 MG tablet 1 tablet, Oral, Daily   aspirin EC 81 mg, Oral, Daily   cholecalciferol (VITAMIN D3) 25 MCG (1000 UNIT) tablet 1 tablet, Oral, Daily   Coenzyme Q10 (CO Q10) 100 MG CAPS 1 capsule, Oral, Daily   L-Lysine 1000 MG TABS 1 tablet, Oral, See admin instructions   losartan (COZAAR) 25 mg, Oral, Daily   Multiple Vitamins-Minerals (MULTIVITAMIN WITH MINERALS) tablet 1 tablet, Oral, Daily   rosuvastatin (CRESTOR) 20 mg, Oral, Daily   tamsulosin (FLOMAX) 0.4 mg, Oral, Daily    Radiology:   CT angiogram of the abdomen and pelvis with and without contrast 05/05/2021: Comparison 05/08/2020. 1. Stable 3.1 cm aortic arch and 4.8 cm juxtarenal/infrarenal aortic aneurysms. Recommend follow-up CT/MR every 6 months (the patient is currently under the care of a vascular specialist/surgeon). 2. 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 3. Left renal artery stenosis of possible hemodynamic significance. 4. Coronary and Aortic Atherosclerosis (ICD10-170.0). 5.   Emphysema (ICD10-J43.9). 6. Prostate enlargement with trabeculated bladder suggesting a degree of bladder outlet obstruction. 7. Colonic diverticulosis.  Cardiac Studies:   Echocardiogram 11/10/2017:    - Left ventricle: mild inferior wall hypokinesis. The cavity size  was normal. Systolic function was normal. The estimated ejection  fraction was in the range of 50% to 55%. Wall motion was normal;  there were no regional wall motion abnormalities. Left  ventricular diastolic function parameters were normal. - Left atrium: The atrium was mildly dilated. - Right ventricle: The cavity size was mildly dilated. Wall  thickness was normal. - Right atrium: The atrium was mildly dilated. - Atrial septum: No defect or patent foramen ovale was identified.  Carotid artery duplex 12/30/2020: Stenosis in the right internal carotid artery (50-69%). Right external carotid stenosis of <50% Stenosis in the left common carotid artery (<50%). Antegrade right vertebral artery flow. Antegrade left vertebral artery flow. Follow up in six months is appropriate if clinically indicated. Compared to 06/19/2020, no significant change.   EKG:   EKG 11/05/2021: Normal sinus rhythm at rate of 72 bpm, left axis deviation, left anterior fascicular block.  Incomplete right bundle branch block.  Prominent R in V1, probably nonspecific but cannot exclude RVH.  Consider pulmonary disease pattern. T wave abnormality, cannot exclude anteroseptal ischemia.    Assessment     ICD-10-CM   1. Infrarenal abdominal aortic aneurysm (AAA) without rupture  I71.43 EKG   12-Lead    2. Mixed hyperlipidemia  E78.2 rosuvastatin (CRESTOR) 20 MG tablet    3. Asymptomatic bilateral carotid artery stenosis  I65.23     4. 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 Renal artery stenosis (HCC) Left by CTA 05/05/21  I70.1     6. Nonspecific abnormal  electrocardiogram (ECG) (EKG)  R94.31 PCV ECHOCARDIOGRAM COMPLETE    PCV MYOCARDIAL PERFUSION WO LEXISCAN       Medications Discontinued During This Encounter  Medication Reason   ofloxacin (OCUFLOX) 0.3 % ophthalmic solution    sulfamethoxazole-trimethoprim (BACTRIM DS) 800-160 MG tablet    atenolol (TENORMIN) 25 MG tablet Side effect (s)   rosuvastatin (CRESTOR) 10 MG tablet     Meds ordered this encounter  Medications   rosuvastatin (CRESTOR) 20 MG tablet    Sig: Take 1 tablet (20 mg total) by mouth daily.    Dispense:  90 tablet    Refill:  3   Orders Placed This Encounter  Procedures   PCV MYOCARDIAL PERFUSION WO LEXISCAN    Standing Status:   Future    Standing Expiration Date:   01/06/2022   EKG 12-Lead   PCV ECHOCARDIOGRAM COMPLETE    Standing Status:   Future    Standing Expiration Date:   11/05/2022   Recommendations:   RAYANE GALLARDO is a 70 y.o. Caucasian male with asymptomatic carotid artery stenosis, hypertension, hyperlipidemia and chronic palpitations. He was last seen by [place] in 2019.  He now presents for follow-up of carotid artery disease.  His dizziness is related to low blood pressure and mild orthostatic hypotension.  Patient has been doing relatively well and has not had any recent hospitalization or ED visits.  Upon holding atenolol he had felt well.  In view of low blood pressure will discontinue atenolol.  He needs continued surveillance of carotid artery stenosis.  We will repeat duplex.  I reviewed his external labs, lipids are not well controlled, will increase Crestor from 10 mg to 20 mg daily.  May check lipids in 4 to 6 weeks.  He complains of severe lifestyle limiting claudication involving the left leg, although he thinks it is arthritis, clearly he has colder leg on the left, absent popliteal pulse and below and suspect he has high-grade or occluded left SFA and potentially left iliac artery stenosis.  Will obtain lower extremity arterial duplex.   I reviewed his report of CT angiogram of the abdomen revealing high-grade left renal artery stenosis and also left iliac artery aneurysm.  There is mild increase in size in 2022 compared to June 2021.  We will evaluate this further after his duplex.     In view of underlying vascular risks, he needs cardiac stress testing for restratification and also repeat echocardiogram to evaluate abnormal EKG.  I would like to see him back in 4 to 6 weeks for follow-up and I will make further recommendations.  This was a greater than 60-minute office visit encounter with the evaluation of external records, external labs, review of CT angiogram report and discussions regarding risk factor modification.    Adrian Prows, MD, [facility] 11/05/2021, 9:44 PM Office: [phone]

## 2021-11-06 ENCOUNTER — Ambulatory Visit (HOSPITAL_COMMUNITY)
Admission: RE | Admit: 2021-11-06 | Discharge: 2021-11-06 | Disposition: A | Discharge status: Home / Self Care | Payer: Medicare HMO | Source: Ambulatory Visit | Attending: Physician Assistant | Admitting: Physician Assistant

## 2021-11-06 ENCOUNTER — Ambulatory Visit: Payer: Medicare HMO | Admitting: Physician Assistant

## 2021-11-06 VITALS — BP 91/64 | HR 68 | Temp 97.7°F | Resp 20 | Ht 69.0 in | Wt 144.0 lb

## 2021-11-06 DIAGNOSIS — I716 Thoracoabdominal aortic aneurysm, without rupture, unspecified: Secondary | ICD-10-CM

## 2021-11-06 NOTE — Progress Notes (Signed)
Office Note     CC:  follow up Requesting Provider:  Vernie Shanks, MD  HPI: Ian [name] is a 70 y.o. ([birth year]) male who presents for follow up of Type IV Thoracoabdominal aneurysm. He was last seen in May by Dr. Oneida Alar after CTA follow up. On CTA The juxtarenal portion of the thoracoabdominal aneurysm was 4.8 cm. This was an increase from prior study of 4.5 cm in October of 2021.The aortic diameter at the level of the superior mesenteric artery was 2.7 cm.  At the level of the celiac it is 3.2 cm.  At the level of the renal arteries it is 4 cm.  Of note he also has a 3.1 cm dilation of his aortic arch.  This was stable.  Today he presents with his wife. He reports no abdominal pain or back pain. He does have some heaviness in his left > right leg on ambulation. He says this is minimal. He more so has pain in his left knee that limits his tolerance for ambulation. He reports no rest pain or tissue loss. He is followed by Dr. Einar Gip for known carotid artery disease as well as CAD/ PAD. Just yesterday he was taken off of Atenolol due to continued hypotension. He is scheduled to have further evaluation of his CAD and PAD with Dr. Einar Gip on 12/19. He reports no chest pain, no shortness of breath.  The pt is on a statin for cholesterol management.  The pt is on a daily aspirin.   Other AC:  none The pt is on ARB for hypertension.   The pt is not diabetic.   Tobacco hx:  Former, quit 2015  Past Medical History:  Diagnosis Date   Chronic kidney disease    Hypercholesteremia     Past Surgical History:  Procedure Laterality Date   NOSE SURGERY      Social History   Socioeconomic History   Marital status: Married    Spouse name: Not on file   Number of children: 0   Years of education: 16   Highest education level: Bachelor's degree (e.g., BA, AB, BS)  Occupational History   Occupation: Quality control  Tobacco Use   Smoking status: Former    Packs/day: 0.50    Years: 40.00     Pack years: 20.00    Types: Cigarettes    Quit date: 2015    Years since quitting: 7.9   Smokeless tobacco: Never  Vaping Use   Vaping Use: Never used  Substance and Sexual Activity   Alcohol use: Yes    Comment: occ   Drug use: No   Sexual activity: Not on file  Other Topics Concern   Not on file  Social History Narrative   Lives at home with his wife.   Right-handed.   No daily use of caffeine.   Social Determinants of Health   Financial Resource Strain: Not on file  Food Insecurity: Not on file  Transportation Needs: Not on file  Physical Activity: Not on file  Stress: Not on file  Social Connections: Not on file  Intimate Partner Violence: Not on file    Family History  Problem Relation Age of Onset   Colon cancer Mother    Bladder Cancer Father     Current Outpatient Medications  Medication Sig Dispense Refill   Ascorbic Acid (VITAMIN C) 1000 MG tablet Take 1 tablet by mouth daily.     aspirin EC 81 MG tablet Take 1 tablet (81  mg total) by mouth daily. 90 tablet 3   cholecalciferol (VITAMIN D3) 25 MCG (1000 UNIT) tablet Take 1 tablet by mouth daily.     Coenzyme Q10 (CO Q10) 100 MG CAPS Take 1 capsule by mouth daily.     L-Lysine 1000 MG TABS Take 1 tablet by mouth See admin instructions.     losartan (COZAAR) 25 MG tablet Take 1 tablet (25 mg total) by mouth daily. 90 tablet 1   Multiple Vitamins-Minerals (MULTIVITAMIN WITH MINERALS) tablet Take 1 tablet by mouth daily.     rosuvastatin (CRESTOR) 20 MG tablet Take 1 tablet (20 mg total) by mouth daily. 90 tablet 3   tamsulosin (FLOMAX) 0.4 MG CAPS capsule Take 0.4 mg by mouth daily.     No current facility-administered medications for this visit.    No Known Allergies   REVIEW OF SYSTEMS:  [X]  denotes positive finding, [ ]  denotes negative finding Cardiac  Comments:  Chest pain or chest pressure:    Shortness of breath upon exertion:    Short of breath when lying flat:    Irregular heart rhythm:         Vascular    Pain in calf, thigh, or hip brought on by ambulation:    Pain in feet at night that wakes you up from your sleep:     Blood clot in your veins:    Leg swelling:         Pulmonary    Oxygen at home:    Productive cough:     Wheezing:         Neurologic    Sudden weakness in arms or legs:     Sudden numbness in arms or legs:     Sudden onset of difficulty speaking or slurred speech:    Temporary loss of vision in one eye:     Problems with dizziness:         Gastrointestinal    Blood in stool:     Vomited blood:         Genitourinary    Burning when urinating:     Blood in urine:        Psychiatric    Major depression:         Hematologic    Bleeding problems:    Problems with blood clotting too easily:        Skin    Rashes or ulcers:        Constitutional    Fever or chills:      PHYSICAL EXAMINATION:  Vitals:   11/06/21 0916  BP: 91/64  Pulse: 68  Resp: 20  Temp: 97.7 F (36.5 C)  SpO2: 99%  Weight: 144 lb (65.3 kg)  Height: 5\' 9"  (1.753 m)    General:  WDWN in NAD; vital signs documented above Gait: Normal HENT: WNL, normocephalic Pulmonary: normal non-labored breathing , without wheezing Cardiac: regular HR, without Murmurs, no carotid bruit Abdomen: soft, NT, no masses. Palpable aortic pulse present Vascular Exam/Pulses:  Right Left  Radial 2+ (normal) 2+ (normal)  Femoral 2+ (normal) 2+ (normal)  Popliteal Not palpable Not palpable  DP 2+ (normal) 2+ (normal)  PT 1+ (weak) 1+ (weak)   Extremities: without ischemic changes, without Gangrene , without cellulitis; without open wounds;  Musculoskeletal: no muscle wasting or atrophy  Neurologic: A&O X 3;  No focal weakness or paresthesias are detected Psychiatric:  The pt has Normal affect.   Non-Invasive Vascular Imaging:   Abdominal Aorta  Findings:  +-----------+-------+----------+----------+--------+--------+--------+  Location   AP (cm)Trans (cm)PSV  (cm/s)WaveformThrombusComments  +-----------+-------+----------+----------+--------+--------+--------+  Proximal   2.80   2.80      96                                  +-----------+-------+----------+----------+--------+--------+--------+  Mid        4.70   5.19      26                                  +-----------+-------+----------+----------+--------+--------+--------+  Distal     1.60   1.75      54                                  +-----------+-------+----------+----------+--------+--------+--------+  RT CIA Prox1.2    1.3       112                                 +-----------+-------+----------+----------+--------+--------+--------+  LT CIA Prox1.2    1.2       116                                 +-----------+-------+----------+----------+--------+--------+--------+   Summary:  Abdominal Aorta: There is evidence of abnormal dilatation of the mid Abdominal aorta. The largest aortic measurement is 5.2 cm. The largest aortic diameter has increased compared to prior exam (CTA). Previous diameter measurement was 4.8 cm obtained on 05/05/2021.   ASSESSMENT/PLAN:: 70 y.o. male here for follow up for thoracoabdominal aneurysm. He has no associated symptoms of back pain or abdominal pain. His duplex today overall shows stable aneurysm. It is slightly larger on duplex in comparison to his last CTA 4.8 to 5.2. Discussed with patient and his wife that this is overall unchanged from prior imaging. He will be due for his annual CTA follow up at time of his next visit.  - Continue follow up with Cardiologist, Dr. Einar Gip for HTN and HR management - Discussed importance of continued adequate blood pressure control - Continue Aspirin and Statin - he will follow up in 3 months with CTA Chest, Abd, Pelvis as yearly assessment of thoracic and Perivisceral evaluation of his Aneurysms. He will see Dr. Virl Cagey to follow up and review CTA at that time   Karoline Caldwell,  PA-C [facility] [phone]  On call MD:   Scot Dock

## 2021-11-07 ENCOUNTER — Encounter: Payer: Self-pay | Admitting: Cardiology

## 2021-11-18 ENCOUNTER — Ambulatory Visit: Payer: Managed Care, Other (non HMO)

## 2021-11-18 ENCOUNTER — Other Ambulatory Visit: Payer: Self-pay

## 2021-11-18 DIAGNOSIS — I739 Peripheral vascular disease, unspecified: Secondary | ICD-10-CM

## 2021-11-18 DIAGNOSIS — R9431 Abnormal electrocardiogram [ECG] [EKG]: Secondary | ICD-10-CM

## 2021-11-23 ENCOUNTER — Ambulatory Visit: Payer: Medicare HMO

## 2021-11-23 ENCOUNTER — Other Ambulatory Visit: Payer: Self-pay

## 2021-11-23 DIAGNOSIS — I739 Peripheral vascular disease, unspecified: Secondary | ICD-10-CM

## 2021-11-23 DIAGNOSIS — R9431 Abnormal electrocardiogram [ECG] [EKG]: Secondary | ICD-10-CM

## 2021-11-25 NOTE — Progress Notes (Signed)
Lower Extremity Arterial Duplex 11/23/2021: No hemodynamically significant stenoses are identified in the right lower extremity arterial system.  Significant velocity increase at the left mid superficial femoral artery suggests >50% stenosis. This exam reveals normal perfusion of the right lower extremity (ABI 1.20) and normal perfusion of the left lower extremity (ABI 1.05).   Symptoms correlate with left SFA stenosis, will discuss on OV soon

## 2021-12-04 ENCOUNTER — Other Ambulatory Visit: Payer: Self-pay | Admitting: Cardiology

## 2021-12-08 ENCOUNTER — Telehealth: Payer: Self-pay

## 2021-12-08 DIAGNOSIS — R338 Other retention of urine: Secondary | ICD-10-CM | POA: Diagnosis not present

## 2021-12-08 NOTE — Telephone Encounter (Signed)
Patient calls today to report that he is seeing a urologist and wants to know if any tests performed will interfere with his thoracoabdominal aneurysm. Verified with PA, and assured him it would be fine.

## 2021-12-16 DIAGNOSIS — R338 Other retention of urine: Secondary | ICD-10-CM | POA: Diagnosis not present

## 2021-12-18 ENCOUNTER — Other Ambulatory Visit: Payer: Self-pay

## 2021-12-18 ENCOUNTER — Encounter: Payer: Self-pay | Admitting: Cardiology

## 2021-12-18 ENCOUNTER — Ambulatory Visit: Payer: Medicare HMO | Admitting: Cardiology

## 2021-12-18 VITALS — BP 108/69 | HR 66 | Temp 97.7°F | Resp 17 | Ht 69.0 in | Wt 148.8 lb

## 2021-12-18 DIAGNOSIS — I2510 Atherosclerotic heart disease of native coronary artery without angina pectoris: Secondary | ICD-10-CM | POA: Diagnosis not present

## 2021-12-18 DIAGNOSIS — E7800 Pure hypercholesterolemia, unspecified: Secondary | ICD-10-CM

## 2021-12-18 DIAGNOSIS — I6523 Occlusion and stenosis of bilateral carotid arteries: Secondary | ICD-10-CM | POA: Diagnosis not present

## 2021-12-18 DIAGNOSIS — R002 Palpitations: Secondary | ICD-10-CM | POA: Diagnosis not present

## 2021-12-18 DIAGNOSIS — I739 Peripheral vascular disease, unspecified: Secondary | ICD-10-CM

## 2021-12-18 DIAGNOSIS — I701 Atherosclerosis of renal artery: Secondary | ICD-10-CM

## 2021-12-18 MED ORDER — ATENOLOL 25 MG PO TABS: 25.0000 mg | ORAL_TABLET | Freq: Every day | ORAL | 3 refills | Status: DC

## 2021-12-18 NOTE — Progress Notes (Signed)
Primary Physician/Referring:  Vernie Shanks, MD  Patient ID: Ian [name], male    DOB: [birth year], 71 y.o.   MRN: [id number]  Chief Complaint  Patient presents with   Follow-up    6 WEEKS   PAD   AORTIC ANEURYSM   Hyperlipidemia   HPI:    Ian [name]  is a 71 y.o. Caucasian male with asymptomatic carotid artery stenosis, hypertension, hyperlipidemia and chronic palpitations.  He also has peripheral arterial disease, abdominal attic aneurysm.  Is presently doing with, except for left leg weakness and especially left knee pain he denies any other symptoms.  He is presently doing self-catheterization for atonic bladder.  He is accompanied by his wife at the bedside.  He denies any chest pain or shortness of breath.  On his last office visit due to dizziness he had discontinued atenolol but he has had recurrence of palpitations since he has started this back.  Otherwise no new symptomatology.  He underwent nuclear stress testing and echocardiogram and presents for follow-up.  He also had lower extremity arterial duplex.  Past Medical History:  Diagnosis Date   Chronic kidney disease    Hypercholesteremia    Past Surgical History:  Procedure Laterality Date   NOSE SURGERY     Family History  Problem Relation Age of Onset   Colon cancer Mother    Bladder Cancer Father     Social History   Tobacco Use   Smoking status: Former    Packs/day: 0.50    Years: 40.00    Pack years: 20.00    Types: Cigarettes    Quit date: 2015    Years since quitting: 8.0   Smokeless tobacco: Never  Substance Use Topics   Alcohol use: Yes    Comment: occ   Marital Status: Married  ROS  Review of Systems  Eyes:  Negative for blurred vision and double vision.  Cardiovascular:  Positive for claudication (left leg). Negative for chest pain, dyspnea on exertion and leg swelling.  Gastrointestinal:  Negative for melena.  Genitourinary:  Positive for hesitancy (self cath).   Objective  Blood pressure 108/69, pulse 66, temperature 97.7 F (36.5 C), temperature source Temporal, resp. rate 17, height _0  (1.753 m), weight 148 lb 12.8 oz (67.5 kg), SpO2 98 %. Body mass index is 21.97 kg/m.  Vitals with BMI 12/18/2021 11/06/2021 11/05/2021  Height _1  _2  _3   Weight 148 lbs 13 oz 144 lbs 144 lbs 10 oz  BMI 21.96 13.24 40.10  Systolic 272 91 97  Diastolic 69 64 58  Pulse 66 68 81    Physical Exam Constitutional:      Appearance: He is normal weight.  Neck:     Vascular: Carotid bruit (left) present. No JVD.  Cardiovascular:     Rate and Rhythm: Normal rate and regular rhythm.     Pulses: Intact distal pulses.          Femoral pulses are 2+ on the right side and 0 on the left side.      Popliteal pulses are 2+ on the right side and 0 on the left side.       Dorsalis pedis pulses are 1+ on the right side and 0 on the left side.       Posterior tibial pulses are 0 on the right side and 0 on the left side.     Heart sounds: Normal heart sounds. No murmur heard.   No gallop.  Pulmonary:     Effort: Pulmonary effort is normal.     Breath sounds: Normal breath sounds.  Abdominal:     General: Bowel sounds are normal.     Palpations: Abdomen is soft.  Musculoskeletal:     Right lower leg: No edema.     Left lower leg: No edema.     Comments: Left leg cooler. Hair loss bilateral legs. Capillary refill reduced to > 3 Sec left leg.     Laboratory examination:   Lipid Panel No results for input(s): CHOL, TRIG, LDLCALC, VLDL, HDL, CHOLHDL, LDLDIRECT in the last 8760 hours.  Lipid Panel     Component Value Date/Time   CHOL 229 (H) 09/27/2019 1051   TRIG 118 09/27/2019 1051   HDL 71 09/27/2019 1051   CHOLHDL 3.2 09/27/2019 1051   LDLCALC 137 (H) 09/27/2019 1051   LABVLDL 21 09/27/2019 1051    External labs:   Labs 10/28/2021:  WBC 7.9   4.0-11.0 RBC 4.48   4.20-5.80 HGB 13.8   13.0-17.0 HCT 41.1   39.0-52.0 MCV 91.7   80.0-94.0 MCH 30.8    27.0-33.0 MCHC 33.6   32.0-36.0 RDW 14.3   11.5-15.5 PLT 286   150-400  Glucose 105   70-99 BUN 28   6-26 Creatinine 1.13   0.60-1.30 eGFR2021 70   >60 Sodium 138   136-145 Potassium 5.1   3.5-5.5 Protein, Total 7.8   6.0-8.3 Albumin 4.5   3.4-4.8 TBIL 0.4   0.3-1.0 ALP 51   38-126 AST 20   0-39  Labs 08/20/2021:  Total cholesterol 178, triglycerides 166, HDL 50, LDL 99, non-HDL cholesterol 128.  A1c 6.1%.  PSA normal.  Hb 13.8/HCT 41.1, platelets 286, normal indicis.  Serum glucose 1 1 5  mg, BUN 28, creatinine 1.13, EGFR 70 mL, potassium 5.1, CMP otherwise normal. Medications and allergies  No Known Allergies   Medication prior to this encounter:   Outpatient Medications Prior to Visit  Medication Sig Dispense Refill   aspirin EC 81 MG tablet Take 1 tablet (81 mg total) by mouth daily. 90 tablet 3   Coenzyme Q10 (CO Q10) 100 MG CAPS Take 1 capsule by mouth daily.     losartan (COZAAR) 25 MG tablet TAKE 1 TABLET (25 MG TOTAL) BY MOUTH DAILY. 90 tablet 1   Multiple Vitamins-Minerals (MULTIVITAMIN WITH MINERALS) tablet Take 1 tablet by mouth daily.     rosuvastatin (CRESTOR) 20 MG tablet Take 1 tablet (20 mg total) by mouth daily. 90 tablet 3   atenolol (TENORMIN) 25 MG tablet Take 25 mg by mouth daily.     Ascorbic Acid (VITAMIN C) 1000 MG tablet Take 1 tablet by mouth daily.     cholecalciferol (VITAMIN D3) 25 MCG (1000 UNIT) tablet Take 1 tablet by mouth daily.     L-Lysine 1000 MG TABS Take 1 tablet by mouth See admin instructions.     tamsulosin (FLOMAX) 0.4 MG CAPS capsule Take 0.4 mg by mouth daily.     No facility-administered medications prior to visit.     Medication list after today's encounter   Current Outpatient Medications  Medication Instructions   aspirin EC 81 mg, Oral, Daily   atenolol (TENORMIN) 25 mg, Oral, Daily   Coenzyme Q10 (CO Q10) 100 MG CAPS 1 capsule, Oral, Daily   losartan (COZAAR) 25 mg, Oral, Daily   Multiple Vitamins-Minerals  (MULTIVITAMIN WITH MINERALS) tablet 1 tablet, Oral, Daily   rosuvastatin (CRESTOR) 20 mg, Oral, Daily    Radiology:  CT angiogram of the abdomen and pelvis with and without contrast 05/05/2021: Comparison 05/08/2020. 1. Stable 3.1 cm aortic arch and 4.8 cm juxtarenal/infrarenal aortic aneurysms. Recommend follow-up CT/MR every 6 months (the patient is currently under the care of a vascular specialist/surgeon). 2. 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 3. Left renal artery stenosis of possible hemodynamic significance. 4. Coronary and Aortic Atherosclerosis (ICD10-170.0). 5.  Emphysema (ICD10-J43.9). 6. Prostate enlargement with trabeculated bladder suggesting a degree of bladder outlet obstruction. 7. Colonic diverticulosis.  Cardiac Studies:   Carotid artery duplex 01-21-21: Stenosis in the right internal carotid artery (50-69%). Right external carotid stenosis of <50% Stenosis in the left common carotid artery (<50%). Antegrade right vertebral artery flow. Antegrade left vertebral artery flow. Follow up in six months is appropriate if clinically indicated. Compared to 06/19/2020, no significant change.   Lower Extremity Arterial Duplex 11/23/2021: No hemodynamically significant stenoses are identified in the right lower extremity arterial system.  Significant velocity increase at the left mid superficial femoral artery suggests >50% stenosis. This exam reveals normal perfusion of the right lower extremity (ABI 1.20) and normal perfusion of the left lower extremity (ABI 1.05).  Abdominal Aortic Duplex 11/06/2021: Abdominal Aorta: There is evidence of abnormal dilatation of the mid Abdominal aorta. The largest aortic measurement is 5.2 cm. The largest aortic diameter has increased compared to prior exam (CTA). Previous diameter measurement was 4.8 cm obtained on 05/05/2021.  PCV MYOCARDIAL PERFUSION WO LEXISCAN 11/18/2021  Narrative Lexiscan (with Mod Bruce  protocol) Nuclear stress test 11/18/2021: Non-diagnostic ECG stress. There is a small sized severe reversible severe defect in the apical region. Overall LV systolic function is normal without regional wall motion abnormalities. Stress LV EF: 50%. No previous exam available for comparison. Intermediate risk study in view of low normal LVEF and apical ischemia.    PCV ECHOCARDIOGRAM COMPLETE 11/23/2021  Narrative Echocardiogram 11/23/2021: Left ventricle cavity is normal in size and wall thickness. Normal global wall motion. Normal LV systolic function with visual EF 50-55%. Doppler evidence of grade I (impaired) diastolic dysfunction. Mild (Grade I) mitral regurgitation. Proximal ascending aorta measures 3.7 cm, which is normal. Mild biatrial dilatation seen on study in 2018 not seen on this study.     EKG:   EKG 11/05/2021: Normal sinus rhythm at rate of 72 bpm, left axis deviation, left anterior fascicular block.  Incomplete right bundle branch block.  Prominent R in V1, probably nonspecific but cannot exclude RVH.  Consider pulmonary disease pattern. T wave abnormality, cannot exclude anteroseptal ischemia.    Assessment     ICD-10-CM   1. Claudication in peripheral vascular disease (HCC)  I73.9     2. Coronary artery disease involving native coronary artery of native heart without angina pectoris  I25.10 Lipid Panel With LDL/HDL Ratio    High sensitivity CRP    3. Renal artery stenosis (HCC) Left by CTA 05/05/21  I70.1     4. Bilateral carotid artery stenosis  I65.23     5. Palpitations  R00.2 atenolol (TENORMIN) 25 MG tablet       Medications Discontinued During This Encounter  Medication Reason   Ascorbic Acid (VITAMIN C) 1000 MG tablet    cholecalciferol (VITAMIN D3) 25 MCG (1000 UNIT) tablet    L-Lysine 1000 MG TABS    tamsulosin (FLOMAX) 0.4 MG CAPS capsule    atenolol (TENORMIN) 25 MG tablet Reorder     Meds ordered this encounter  Medications   atenolol  (TENORMIN) 25 MG  tablet    Sig: Take 1 tablet (25 mg total) by mouth daily.    Dispense:  100 tablet    Refill:  3   Orders Placed This Encounter  Procedures   Lipid Panel With LDL/HDL Ratio   High sensitivity CRP   Recommendations:   MARKESE BLOXHAM is a 71 y.o. Caucasian male with asymptomatic carotid artery stenosis, hypertension, hyperlipidemia and chronic palpitations.  Peripheral artery disease, abdominal [place] being followed by vascular surgery, hyperlipidemia presents here for follow-up.  On his last office visit 6 to 8 weeks ago I had increased the dose of Crestor from 10 mg to 20 mg as his LDL was not at goal.  We will obtain lipids today along with CRP.  On his previous visit, he had discontinued taking carvedilol due to dizziness, low blood pressure, but he has had recurrence of palpitations.  So he has restarted back the medication, I will refill his prescription.  Blood pressure is stable today.  With regard to carotid artery stenosis, he needs surveillance duplex.  He has moderate disease in the left carotid.  With regard to peripheral arterial disease and symptoms of claudication, he vehemently denies any symptoms of claudication in the left leg and states that his pain is mostly located in the left knee.  His left lower extremity is clearly much more wasted than the right along with hair loss and is also cooler but there is no suggestion of critical limb ischemia.  As he remains minimally symptomatic if he is, we will continue medical therapy for now.  His abdominal aortic aneurysm is being followed by vascular surgery.  He does have renal artery stenosis however asymptomatic from this.  No clinical evidence of heart failure, blood pressure is also well controlled.  Hence no need for further evaluation of the same.  I will see him back in 6 months for follow-up, I have explained to them regarding the manifestation of PAD and critical limb ischemia.    I have also  discussed with him and his wife at the bedside regarding secondary prevention, aggressive control of lipids, lifestyle modification and regular exercise will also certainly improve his overall vascular status.  40-minute office visit encounter.    Adrian Prows, MD, [facility] 12/18/2021, 10:26 AM Office: [phone]

## 2021-12-19 ENCOUNTER — Encounter: Payer: Self-pay | Admitting: Cardiology

## 2021-12-19 LAB — LIPID PANEL WITH LDL/HDL RATIO
Cholesterol, Total: 172 mg/dL (ref 100–199)
HDL: 49 mg/dL (ref >39)
LDL Chol Calc (NIH): 103 mg/dL — ABNORMAL HIGH (ref 0–99)
LDL/HDL Ratio: 2.1 ratio (ref 0.0–3.6)
Triglycerides: 108 mg/dL (ref 0–149)
VLDL Cholesterol Cal: 20 mg/dL (ref 5–40)

## 2021-12-19 LAB — HIGH SENSITIVITY CRP: CRP, High Sensitivity: 3.97 mg/L — ABNORMAL HIGH (ref 0.00–3.00)

## 2021-12-19 MED ORDER — EZETIMIBE 10 MG PO TABS: 10.0000 mg | ORAL_TABLET | Freq: Every day | ORAL | 3 refills | Status: DC

## 2021-12-19 MED ORDER — ROSUVASTATIN CALCIUM 40 MG PO TABS: 40.0000 mg | ORAL_TABLET | Freq: Every day | ORAL | 3 refills | Status: DC

## 2021-12-19 NOTE — Addendum Note (Signed)
Addended by: Kela Millin on: 12/19/2021 10:17 AM   Modules accepted: Orders

## 2021-12-19 NOTE — Progress Notes (Addendum)
Discussed with the patient, LDL is not at goal.  He will increase the dose of Crestor to 40 mg and I will add ezetimibe 10 mg daily.  I discussed with the patient and his wife for 15 minutes over the telephone regarding management of lipids.  He had an appointment to see me in 6 months, however I prefer to see him back in 6 weeks in view of high ASCVD risk.  I will repeat lipids prior to his next office visit.    ICD-10-CM   1. Claudication in peripheral vascular disease (HCC)  I73.9     2. Coronary artery disease involving native coronary artery of native heart without angina pectoris  I25.10 Lipid Panel With LDL/HDL Ratio    High sensitivity CRP    3. Renal artery stenosis (HCC) Left by CTA 05/05/21  I70.1     4. Bilateral carotid artery stenosis  I65.23     5. Palpitations  R00.2 atenolol (TENORMIN) 25 MG tablet    6. Pure hypercholesterolemia  E78.00 rosuvastatin (CRESTOR) 40 MG tablet    ezetimibe (ZETIA) 10 MG tablet    Lipid Panel With LDL/HDL Ratio      Meds ordered this encounter  Medications   atenolol (TENORMIN) 25 MG tablet    Sig: Take 1 tablet (25 mg total) by mouth daily.    Dispense:  100 tablet    Refill:  3   rosuvastatin (CRESTOR) 40 MG tablet    Sig: Take 1 tablet (40 mg total) by mouth daily.    Dispense:  90 tablet    Refill:  3   ezetimibe (ZETIA) 10 MG tablet    Sig: Take 1 tablet (10 mg total) by mouth daily after supper.    Dispense:  90 tablet    Refill:  3

## 2021-12-21 ENCOUNTER — Encounter: Payer: Self-pay | Admitting: Cardiology

## 2021-12-21 ENCOUNTER — Other Ambulatory Visit: Payer: Self-pay

## 2021-12-21 DIAGNOSIS — R002 Palpitations: Secondary | ICD-10-CM

## 2021-12-21 DIAGNOSIS — I6523 Occlusion and stenosis of bilateral carotid arteries: Secondary | ICD-10-CM

## 2021-12-21 DIAGNOSIS — I2510 Atherosclerotic heart disease of native coronary artery without angina pectoris: Secondary | ICD-10-CM

## 2021-12-21 DIAGNOSIS — I10 Essential (primary) hypertension: Secondary | ICD-10-CM

## 2021-12-21 NOTE — Telephone Encounter (Signed)
From patient.

## 2021-12-22 ENCOUNTER — Other Ambulatory Visit: Payer: Self-pay

## 2021-12-22 ENCOUNTER — Other Ambulatory Visit: Payer: Medicare HMO

## 2021-12-22 DIAGNOSIS — I6523 Occlusion and stenosis of bilateral carotid arteries: Secondary | ICD-10-CM

## 2021-12-22 DIAGNOSIS — R339 Retention of urine, unspecified: Secondary | ICD-10-CM | POA: Diagnosis not present

## 2021-12-22 DIAGNOSIS — I10 Essential (primary) hypertension: Secondary | ICD-10-CM | POA: Diagnosis not present

## 2021-12-22 DIAGNOSIS — N319 Neuromuscular dysfunction of bladder, unspecified: Secondary | ICD-10-CM | POA: Diagnosis not present

## 2021-12-28 ENCOUNTER — Other Ambulatory Visit: Payer: Self-pay | Admitting: Cardiology

## 2021-12-28 DIAGNOSIS — I6523 Occlusion and stenosis of bilateral carotid arteries: Secondary | ICD-10-CM

## 2021-12-30 ENCOUNTER — Telehealth: Payer: Self-pay

## 2021-12-31 NOTE — Telephone Encounter (Signed)
Called pt wife to inform her about pt duplex results. Pt wife understood.

## 2021-12-31 NOTE — Telephone Encounter (Signed)
Moderate disease on the right about 50 to 60% and mild on the left, no change from previous.

## 2022-02-01 ENCOUNTER — Encounter: Payer: Self-pay | Admitting: Cardiology

## 2022-02-02 NOTE — Telephone Encounter (Signed)
From patient.

## 2022-02-18 ENCOUNTER — Ambulatory Visit: Payer: Medicare HMO | Admitting: Cardiology

## 2022-02-18 DIAGNOSIS — I77819 Aortic ectasia, unspecified site: Secondary | ICD-10-CM | POA: Diagnosis not present

## 2022-02-18 DIAGNOSIS — E7800 Pure hypercholesterolemia, unspecified: Secondary | ICD-10-CM | POA: Diagnosis not present

## 2022-02-18 DIAGNOSIS — R7301 Impaired fasting glucose: Secondary | ICD-10-CM | POA: Diagnosis not present

## 2022-02-18 DIAGNOSIS — N319 Neuromuscular dysfunction of bladder, unspecified: Secondary | ICD-10-CM | POA: Diagnosis not present

## 2022-02-18 DIAGNOSIS — I6523 Occlusion and stenosis of bilateral carotid arteries: Secondary | ICD-10-CM | POA: Diagnosis not present

## 2022-02-18 DIAGNOSIS — R7989 Other specified abnormal findings of blood chemistry: Secondary | ICD-10-CM | POA: Diagnosis not present

## 2022-02-18 DIAGNOSIS — I2510 Atherosclerotic heart disease of native coronary artery without angina pectoris: Secondary | ICD-10-CM | POA: Diagnosis not present

## 2022-02-22 ENCOUNTER — Encounter: Payer: Self-pay | Admitting: Cardiology

## 2022-02-22 DIAGNOSIS — R899 Unspecified abnormal finding in specimens from other organs, systems and tissues: Secondary | ICD-10-CM | POA: Diagnosis not present

## 2022-02-22 DIAGNOSIS — N319 Neuromuscular dysfunction of bladder, unspecified: Secondary | ICD-10-CM | POA: Diagnosis not present

## 2022-02-22 DIAGNOSIS — R339 Retention of urine, unspecified: Secondary | ICD-10-CM | POA: Diagnosis not present

## 2022-02-23 NOTE — Telephone Encounter (Signed)
Labs 02/18/2022: ? ?Serum glucose 110 mg, BUN 27, creatinine 1.37, EGFR 56 mL, potassium 5.9.  LFTs normal. ? ?A1c 6.1%. ? ?LPA 53.5. Total cholesterol 123, triglycerides 120, HDL 47, LDL 54, non-HDL cholesterol 76. ?

## 2022-02-23 NOTE — Telephone Encounter (Signed)
Looks like the labs have already been scanned in. Please review.

## 2022-02-24 ENCOUNTER — Telehealth: Payer: Self-pay

## 2022-02-24 ENCOUNTER — Encounter: Payer: Self-pay | Admitting: Cardiology

## 2022-02-24 NOTE — Telephone Encounter (Signed)
error 

## 2022-02-24 NOTE — Telephone Encounter (Signed)
If potassium is normal, do not stop. Avoid bananas, kiwi, fruit drinks, avacado

## 2022-02-24 NOTE — Telephone Encounter (Signed)
From pt

## 2022-02-24 NOTE — Telephone Encounter (Signed)
Spoke to Anita and was told to inform pt to stop the losartan until we receive the labs. Pt wife understood.

## 2022-02-24 NOTE — Telephone Encounter (Signed)
From patient.

## 2022-02-26 ENCOUNTER — Encounter: Payer: Self-pay | Admitting: Cardiology

## 2022-02-26 NOTE — Telephone Encounter (Signed)
From patient.

## 2022-02-26 NOTE — Telephone Encounter (Signed)
Called pt and informed wife about the message above.

## 2022-03-01 DIAGNOSIS — M1712 Unilateral primary osteoarthritis, left knee: Secondary | ICD-10-CM | POA: Diagnosis not present

## 2022-03-17 DIAGNOSIS — R338 Other retention of urine: Secondary | ICD-10-CM | POA: Diagnosis not present

## 2022-03-17 DIAGNOSIS — N319 Neuromuscular dysfunction of bladder, unspecified: Secondary | ICD-10-CM | POA: Diagnosis not present

## 2022-03-17 DIAGNOSIS — Z125 Encounter for screening for malignant neoplasm of prostate: Secondary | ICD-10-CM | POA: Diagnosis not present

## 2022-03-31 DIAGNOSIS — N319 Neuromuscular dysfunction of bladder, unspecified: Secondary | ICD-10-CM | POA: Diagnosis not present

## 2022-03-31 DIAGNOSIS — R339 Retention of urine, unspecified: Secondary | ICD-10-CM | POA: Diagnosis not present

## 2022-04-06 DIAGNOSIS — N319 Neuromuscular dysfunction of bladder, unspecified: Secondary | ICD-10-CM | POA: Diagnosis not present

## 2022-04-06 DIAGNOSIS — R748 Abnormal levels of other serum enzymes: Secondary | ICD-10-CM | POA: Diagnosis not present

## 2022-04-06 DIAGNOSIS — R7301 Impaired fasting glucose: Secondary | ICD-10-CM | POA: Diagnosis not present

## 2022-04-06 DIAGNOSIS — I2510 Atherosclerotic heart disease of native coronary artery without angina pectoris: Secondary | ICD-10-CM | POA: Diagnosis not present

## 2022-04-06 DIAGNOSIS — I77819 Aortic ectasia, unspecified site: Secondary | ICD-10-CM | POA: Diagnosis not present

## 2022-04-06 DIAGNOSIS — I6523 Occlusion and stenosis of bilateral carotid arteries: Secondary | ICD-10-CM | POA: Diagnosis not present

## 2022-04-06 DIAGNOSIS — E7800 Pure hypercholesterolemia, unspecified: Secondary | ICD-10-CM | POA: Diagnosis not present

## 2022-04-09 ENCOUNTER — Other Ambulatory Visit: Payer: Self-pay

## 2022-04-09 DIAGNOSIS — I716 Thoracoabdominal aortic aneurysm, without rupture, unspecified: Secondary | ICD-10-CM

## 2022-04-16 ENCOUNTER — Ambulatory Visit (HOSPITAL_COMMUNITY)
Admission: RE | Admit: 2022-04-16 | Discharge: 2022-04-16 | Disposition: A | Discharge status: Home / Self Care | Payer: Medicare HMO | Source: Ambulatory Visit | Attending: Vascular Surgery | Admitting: Vascular Surgery

## 2022-04-16 DIAGNOSIS — I739 Peripheral vascular disease, unspecified: Secondary | ICD-10-CM | POA: Insufficient documentation

## 2022-04-16 DIAGNOSIS — J439 Emphysema, unspecified: Secondary | ICD-10-CM | POA: Diagnosis not present

## 2022-04-16 DIAGNOSIS — I716 Thoracoabdominal aortic aneurysm, without rupture, unspecified: Secondary | ICD-10-CM | POA: Diagnosis not present

## 2022-04-16 DIAGNOSIS — I7143 Infrarenal abdominal aortic aneurysm, without rupture: Secondary | ICD-10-CM | POA: Diagnosis not present

## 2022-04-16 DIAGNOSIS — I700 Atherosclerosis of aorta: Secondary | ICD-10-CM | POA: Diagnosis not present

## 2022-04-16 LAB — POCT I-STAT CREATININE: Creatinine, Ser: 0.9 mg/dL (ref 0.61–1.24)

## 2022-04-16 MED ORDER — IOHEXOL 350 MG/ML SOLN
100.0000 mL | Freq: Once | INTRAVENOUS | Status: AC | PRN
  Administered 2022-04-16: 100 mL via INTRAVENOUS

## 2022-04-21 ENCOUNTER — Encounter: Payer: Self-pay | Admitting: Cardiology

## 2022-04-21 NOTE — Telephone Encounter (Signed)
From patient.

## 2022-04-29 NOTE — Progress Notes (Signed)
Office Note     HPI: Ian [name] is a 71 y.o. (07/19/1951) male presenting for 1 year follow-up with known type IV thoracoabdominal aneurysm.  1 year ago, the aneurysm was measuring 4.8 meters at its largest diameter in the infrarenal portion.  Today, Ian [name] was doing well, accompanied by his wife.  He has had no issues over the last year.  Denies new onset back pain, abdominal pain, chest pain.  He denies symptoms of claudication, ischemic rest pain, tissue loss in the lower extremities.  The pt is on a statin for cholesterol management.  The pt is on a daily aspirin.   Other AC:  - The pt is on medication for hypertension.    Past Medical History:  Diagnosis Date   Chronic kidney disease    Hypercholesteremia     Past Surgical History:  Procedure Laterality Date   NOSE SURGERY      Social History   Socioeconomic History   Marital status: Married    Spouse name: Not on file   Number of children: 0   Years of education: 16   Highest education level: Bachelor's degree (e.g., BA, AB, BS)  Occupational History   Occupation: Quality control  Tobacco Use   Smoking status: Former    Packs/day: 0.50    Years: 40.00    Pack years: 20.00    Types: Cigarettes    Quit date: 2015    Years since quitting: 8.4   Smokeless tobacco: Never  Vaping Use   Vaping Use: Never used  Substance and Sexual Activity   Alcohol use: Yes    Comment: occ   Drug use: No   Sexual activity: Not on file  Other Topics Concern   Not on file  Social History Narrative   Lives at home with his wife.   Right-handed.   No daily use of caffeine.   Social Determinants of Health   Financial Resource Strain: Not on file  Food Insecurity: Not on file  Transportation Needs: Not on file  Physical Activity: Not on file  Stress: Not on file  Social Connections: Not on file  Intimate Partner Violence: Not on file   Family History  Problem Relation Age of Onset   Colon cancer Mother     Bladder Cancer Father     Current Outpatient Medications  Medication Sig Dispense Refill   aspirin EC 81 MG tablet Take 1 tablet (81 mg total) by mouth daily. 90 tablet 3   atenolol (TENORMIN) 25 MG tablet Take 1 tablet (25 mg total) by mouth daily. 100 tablet 3   Coenzyme Q10 (CO Q10) 100 MG CAPS Take 1 capsule by mouth daily.     ezetimibe (ZETIA) 10 MG tablet Take 1 tablet (10 mg total) by mouth daily after supper. 90 tablet 3   losartan (COZAAR) 25 MG tablet TAKE 1 TABLET (25 MG TOTAL) BY MOUTH DAILY. 90 tablet 1   Multiple Vitamins-Minerals (MULTIVITAMIN WITH MINERALS) tablet Take 1 tablet by mouth daily.     rosuvastatin (CRESTOR) 40 MG tablet Take 1 tablet (40 mg total) by mouth daily. 90 tablet 3   No current facility-administered medications for this visit.    No Known Allergies   REVIEW OF SYSTEMS:  '[X]'$  denotes positive finding, '[ ]'$  denotes negative finding Cardiac  Comments:  Chest pain or chest pressure:    Shortness of breath upon exertion:    Short of breath when lying flat:    Irregular heart rhythm:  Vascular    Pain in calf, thigh, or hip brought on by ambulation:    Pain in feet at night that wakes you up from your sleep:     Blood clot in your veins:    Leg swelling:         Pulmonary    Oxygen at home:    Productive cough:     Wheezing:         Neurologic    Sudden weakness in arms or legs:     Sudden numbness in arms or legs:     Sudden onset of difficulty speaking or slurred speech:    Temporary loss of vision in one eye:     Problems with dizziness:         Gastrointestinal    Blood in stool:     Vomited blood:         Genitourinary    Burning when urinating:     Blood in urine:        Psychiatric    Major depression:         Hematologic    Bleeding problems:    Problems with blood clotting too easily:        Skin    Rashes or ulcers:        Constitutional    Fever or chills:      PHYSICAL EXAMINATION:  There were no  vitals filed for this visit.  General:  WDWN in NAD; vital signs documented above Gait: Not observed HENT: WNL, normocephalic Pulmonary: normal non-labored breathing , without wheezing Cardiac: regular HR Abdomen: soft, NT, no masses Skin: without rashes Vascular Exam/Pulses:  Right Left  Radial 2+ (normal) 2+ (normal)              DP 2+ (normal) Nonpalpable       Extremities: without ischemic changes, without Gangrene , without cellulitis; small open wound on the left heel after cutting it yesterday. Musculoskeletal: no muscle wasting or atrophy  Neurologic: A&O X 3;  No focal weakness or paresthesias are detected Psychiatric:  The pt has Normal affect.   Non-Invasive Vascular Imaging:    Essentially unchanged size and configuration of the pararenal abdominal aortic aneurysm, 4.9 cm on the current CT, previously 4.8 cm. Near circumferential mural thrombus without high-grade stenosis. No periaortic fluid or inflammatory changes.    ASSESSMENT/PLAN: [name] is a 71 y.o. male presenting with known type IV thoracoabdominal aneurysm.  This aneurysm is essentially unchanged from last year, measuring 4.9 cm in largest diameter infrarenally, as opposed to 4.8 cm last year.  I had a long conversation with Ian [name] regarding the above.  His aneurysm will require a fenestrated graft versus open thoracoabdominal aneurysm repair.  I discussed that while this can be performed at [facility], my plan would be to refer him to [facility], specifically Dr. Sammuel Hines for repair to have a substantially higher volume.  We discussed the and symptoms of rupture.  I asked him to seek immediate medical attention should any of these occur, and ensure the ED understands that he has an abdominal aortic aneurysm.  My plan is for repeat abdominal aortic ultrasound in 6 months, with CT scan follow-up in 1 year as the aneurysm has remained stable.  At that time, I will also follow-up bilateral carotid duplex  ultrasound, ABI.  Patient's lower extremity peripheral arterial disease is being followed by Dr. Adrian Prows at this time.   Ian John, MD [facility] [phone]

## 2022-04-30 ENCOUNTER — Encounter: Payer: Self-pay | Admitting: Vascular Surgery

## 2022-04-30 ENCOUNTER — Ambulatory Visit: Payer: Medicare HMO | Admitting: Vascular Surgery

## 2022-04-30 VITALS — BP 106/71 | HR 55 | Temp 98.0°F | Resp 20 | Ht 69.0 in | Wt 144.9 lb

## 2022-04-30 DIAGNOSIS — I7162 Paravisceral aneurysm of the thoracoabdominal aorta, without rupture: Secondary | ICD-10-CM | POA: Diagnosis not present

## 2022-05-07 ENCOUNTER — Other Ambulatory Visit: Payer: Self-pay | Admitting: *Deleted

## 2022-05-07 DIAGNOSIS — I716 Thoracoabdominal aortic aneurysm, without rupture, unspecified: Secondary | ICD-10-CM

## 2022-05-24 DIAGNOSIS — N319 Neuromuscular dysfunction of bladder, unspecified: Secondary | ICD-10-CM | POA: Diagnosis not present

## 2022-05-24 DIAGNOSIS — R339 Retention of urine, unspecified: Secondary | ICD-10-CM | POA: Diagnosis not present

## 2022-06-01 ENCOUNTER — Other Ambulatory Visit: Payer: Self-pay | Admitting: Cardiology

## 2022-06-15 ENCOUNTER — Telehealth: Payer: Self-pay

## 2022-06-15 NOTE — Telephone Encounter (Signed)
Pt's wife called stating that he was going to have a tooth pulled next Tuesday and wanted to make sure it was ok to take Amoxicillin with his condition.  Reviewed pt's chart, returned pt's call, two identifiers used. Spoke with PA and advised her that it would be ok to take whatever antibiotic was prescribed by the dentist or oral surgeon. Pt's wife explained that she had read online that some antibiotics weren't good for AAA. She was reassured, questions answered, confirmed understanding.

## 2022-06-18 ENCOUNTER — Ambulatory Visit: Payer: Medicare HMO | Admitting: Cardiology

## 2022-06-21 ENCOUNTER — Telehealth: Payer: Self-pay

## 2022-06-21 NOTE — Telephone Encounter (Signed)
Pt's wife called stating that the pt was having a tooth extraction on 7/18 and needed some reassurance that it was ok to proceed with the surgery.  Reviewed pt's chart, returned call, two identifiers used. Informed pt that I spoke with his wife last week regarding the use of antibiotics and the provider stated antibiotic use would be safe. Informed pt that the oral surgeon had faxed a surgical clearance form that was reviewed, signed by Dr Virl Cagey, and faxed back to the surgeon's office on Friday, 7/14. Pt was appreciative of the thoroughness of all steps taken to ensure his safety. Confirmed understanding.

## 2022-07-09 DIAGNOSIS — N319 Neuromuscular dysfunction of bladder, unspecified: Secondary | ICD-10-CM | POA: Diagnosis not present

## 2022-07-09 DIAGNOSIS — R339 Retention of urine, unspecified: Secondary | ICD-10-CM | POA: Diagnosis not present

## 2022-08-13 ENCOUNTER — Encounter: Payer: Self-pay | Admitting: Vascular Surgery

## 2022-08-13 DIAGNOSIS — R339 Retention of urine, unspecified: Secondary | ICD-10-CM | POA: Diagnosis not present

## 2022-08-13 DIAGNOSIS — N319 Neuromuscular dysfunction of bladder, unspecified: Secondary | ICD-10-CM | POA: Diagnosis not present

## 2022-08-15 ENCOUNTER — Encounter: Payer: Self-pay | Admitting: Cardiology

## 2022-08-16 NOTE — Telephone Encounter (Signed)
From patient.

## 2022-08-19 DIAGNOSIS — E7800 Pure hypercholesterolemia, unspecified: Secondary | ICD-10-CM | POA: Diagnosis not present

## 2022-08-19 DIAGNOSIS — I10 Essential (primary) hypertension: Secondary | ICD-10-CM | POA: Diagnosis not present

## 2022-08-19 DIAGNOSIS — Z789 Other specified health status: Secondary | ICD-10-CM | POA: Diagnosis not present

## 2022-08-19 DIAGNOSIS — Z6821 Body mass index (BMI) 21.0-21.9, adult: Secondary | ICD-10-CM | POA: Diagnosis not present

## 2022-08-19 DIAGNOSIS — R7989 Other specified abnormal findings of blood chemistry: Secondary | ICD-10-CM | POA: Diagnosis not present

## 2022-08-19 DIAGNOSIS — R7303 Prediabetes: Secondary | ICD-10-CM | POA: Diagnosis not present

## 2022-08-23 ENCOUNTER — Encounter: Payer: Self-pay | Admitting: Cardiology

## 2022-08-25 ENCOUNTER — Encounter: Payer: Self-pay | Admitting: Vascular Surgery

## 2022-08-25 NOTE — Telephone Encounter (Signed)
From patient.

## 2022-08-27 NOTE — Telephone Encounter (Signed)
From patient.

## 2022-09-02 ENCOUNTER — Encounter: Payer: Self-pay | Admitting: Cardiology

## 2022-09-02 NOTE — Telephone Encounter (Signed)
From Patient

## 2022-09-03 DIAGNOSIS — M545 Low back pain, unspecified: Secondary | ICD-10-CM | POA: Diagnosis not present

## 2022-09-03 DIAGNOSIS — M48061 Spinal stenosis, lumbar region without neurogenic claudication: Secondary | ICD-10-CM | POA: Diagnosis not present

## 2022-09-04 ENCOUNTER — Encounter: Payer: Self-pay | Admitting: Vascular Surgery

## 2022-09-15 DIAGNOSIS — L570 Actinic keratosis: Secondary | ICD-10-CM | POA: Diagnosis not present

## 2022-09-15 DIAGNOSIS — L821 Other seborrheic keratosis: Secondary | ICD-10-CM | POA: Diagnosis not present

## 2022-09-15 DIAGNOSIS — Z85828 Personal history of other malignant neoplasm of skin: Secondary | ICD-10-CM | POA: Diagnosis not present

## 2022-09-20 DIAGNOSIS — R339 Retention of urine, unspecified: Secondary | ICD-10-CM | POA: Diagnosis not present

## 2022-09-20 DIAGNOSIS — N319 Neuromuscular dysfunction of bladder, unspecified: Secondary | ICD-10-CM | POA: Diagnosis not present

## 2022-09-21 DIAGNOSIS — M5416 Radiculopathy, lumbar region: Secondary | ICD-10-CM | POA: Diagnosis not present

## 2022-10-01 ENCOUNTER — Other Ambulatory Visit: Payer: Self-pay | Admitting: Cardiology

## 2022-10-01 DIAGNOSIS — E7800 Pure hypercholesterolemia, unspecified: Secondary | ICD-10-CM

## 2022-10-14 DIAGNOSIS — M5416 Radiculopathy, lumbar region: Secondary | ICD-10-CM | POA: Diagnosis not present

## 2022-10-20 DIAGNOSIS — R339 Retention of urine, unspecified: Secondary | ICD-10-CM | POA: Diagnosis not present

## 2022-10-20 DIAGNOSIS — N319 Neuromuscular dysfunction of bladder, unspecified: Secondary | ICD-10-CM | POA: Diagnosis not present

## 2022-11-16 DIAGNOSIS — R339 Retention of urine, unspecified: Secondary | ICD-10-CM | POA: Diagnosis not present

## 2022-11-16 DIAGNOSIS — N319 Neuromuscular dysfunction of bladder, unspecified: Secondary | ICD-10-CM | POA: Diagnosis not present

## 2022-11-18 NOTE — Progress Notes (Signed)
Office Note     HPI: Ian [name] is a 71 y.o. ([birth year]) male presenting for 33-monthfollow-up with known type IV thoracoabdominal aneurysm.  1 year ago, the aneurysm was measuring 4.9cm at its largest diameter in the infrarenal portion when measured on CT. Duplex ultrasound demonstrated 5.2cm.  Today, RCheveyowas doing well, accompanied by his wife.  He has had no issues over the last six months.  Denies new onset back pain, abdominal pain, chest pain.  He denies symptoms of claudication, ischemic rest pain, tissue loss in the lower extremities.  The pt is on a statin for cholesterol management.  The pt is on a daily aspirin.   Other AC:  - The pt is on medication for hypertension.    Past Medical History:  Diagnosis Date   Chronic kidney disease    Hypercholesteremia     Past Surgical History:  Procedure Laterality Date   NOSE SURGERY      Social History   Socioeconomic History   Marital status: Married    Spouse name: Not on file   Number of children: 0   Years of education: 16   Highest education level: Bachelor's degree (e.g., BA, AB, BS)  Occupational History   Occupation: Quality control  Tobacco Use   Smoking status: Former    Packs/day: 0.50    Years: 40.00    Total pack years: 20.00    Types: Cigarettes    Quit date: 2015    Years since quitting: 8.9   Smokeless tobacco: Never  Vaping Use   Vaping Use: Never used  Substance and Sexual Activity   Alcohol use: Yes    Comment: occ   Drug use: No   Sexual activity: Not on file  Other Topics Concern   Not on file  Social History Narrative   Lives at home with his wife.   Right-handed.   No daily use of caffeine.   Social Determinants of Health   Financial Resource Strain: Not on file  Food Insecurity: Not on file  Transportation Needs: Not on file  Physical Activity: Not on file  Stress: Not on file  Social Connections: Not on file  Intimate Partner Violence: Not on file   Family  History  Problem Relation Age of Onset   Colon cancer Mother    Bladder Cancer Father     Current Outpatient Medications  Medication Sig Dispense Refill   aspirin EC 81 MG tablet Take 1 tablet (81 mg total) by mouth daily. 90 tablet 3   atenolol (TENORMIN) 25 MG tablet Take 1 tablet (25 mg total) by mouth daily. 100 tablet 3   Coenzyme Q10 (CO Q10) 100 MG CAPS Take 1 capsule by mouth daily.     ezetimibe (ZETIA) 10 MG tablet Take 1 tablet (10 mg total) by mouth daily after supper. 90 tablet 3   losartan (COZAAR) 25 MG tablet TAKE 1 TABLET (25 MG TOTAL) BY MOUTH DAILY. 90 tablet 1   rosuvastatin (CRESTOR) 40 MG tablet TAKE 1 TABLET BY MOUTH EVERY DAY 90 tablet 0   tamsulosin (FLOMAX) 0.4 MG CAPS capsule Take 0.4 mg by mouth daily.     No current facility-administered medications for this visit.    No Known Allergies   REVIEW OF SYSTEMS:  '[X]'$  denotes positive finding, '[ ]'$  denotes negative finding Cardiac  Comments:  Chest pain or chest pressure:    Shortness of breath upon exertion:    Short of breath when lying flat:  Irregular heart rhythm:        Vascular    Pain in calf, thigh, or hip brought on by ambulation:    Pain in feet at night that wakes you up from your sleep:     Blood clot in your veins:    Leg swelling:         Pulmonary    Oxygen at home:    Productive cough:     Wheezing:         Neurologic    Sudden weakness in arms or legs:     Sudden numbness in arms or legs:     Sudden onset of difficulty speaking or slurred speech:    Temporary loss of vision in one eye:     Problems with dizziness:         Gastrointestinal    Blood in stool:     Vomited blood:         Genitourinary    Burning when urinating:     Blood in urine:        Psychiatric    Major depression:         Hematologic    Bleeding problems:    Problems with blood clotting too easily:        Skin    Rashes or ulcers:        Constitutional    Fever or chills:      PHYSICAL  EXAMINATION:  There were no vitals filed for this visit.  General:  WDWN in NAD; vital signs documented above Gait: Not observed HENT: WNL, normocephalic Pulmonary: normal non-labored breathing , without wheezing Cardiac: regular HR Abdomen: soft, NT, no masses Skin: without rashes Vascular Exam/Pulses:  Right Left  Radial 2+ (normal) 2+ (normal)              DP 2+ (normal) Nonpalpable       Extremities: without ischemic changes, without Gangrene , without cellulitis; small open wound on the left heel after cutting it yesterday. Musculoskeletal: no muscle wasting or atrophy  Neurologic: A&O X 3;  No focal weakness or paresthesias are detected Psychiatric:  The pt has Normal affect.   Non-Invasive Vascular Imaging:    Essentially unchanged size and configuration of the pararenal abdominal aortic aneurysm, 4.9 cm on the current CT cm. Near circumferential mural thrombus without high-grade stenosis. No periaortic fluid or inflammatory changes.    ASSESSMENT/PLAN: CORRIN HINGLE is a 71 y.o. male presenting with known type IV thoracoabdominal aneurysm.  This aneurysm is essentially unchanged from last [place] measurement 6 months ago.  I had a long conversation with Kanton regarding the above.  His aneurysm will require a fenestrated graft versus open thoracoabdominal aneurysm repair.  I discussed that while this can be performed at [facility], my plan would be to refer him to [facility], specifically Dr. Sammuel Hines for repair to have a substantially higher volume once the aneurysm meets size criteria for repair.  We discussed the and symptoms of rupture.  I asked him to seek immediate medical attention should any of these occur, and ensure the ED understands that he has an abdominal aortic aneurysm.  My plan is for repeat abdominal aortic ultrasound in 6 months. Patient's lower extremity peripheral arterial disease and carotids are being followed by Dr. Adrian Prows at this time.   Broadus John, MD [facility] [phone]

## 2022-11-19 ENCOUNTER — Encounter: Payer: Self-pay | Admitting: Vascular Surgery

## 2022-11-19 ENCOUNTER — Ambulatory Visit: Payer: Medicare HMO | Admitting: Vascular Surgery

## 2022-11-19 ENCOUNTER — Ambulatory Visit (HOSPITAL_COMMUNITY)
Admission: RE | Admit: 2022-11-19 | Discharge: 2022-11-19 | Disposition: A | Discharge status: Home / Self Care | Payer: Medicare HMO | Source: Ambulatory Visit | Attending: Vascular Surgery | Admitting: Vascular Surgery

## 2022-11-19 VITALS — BP 136/80 | HR 53 | Temp 97.9°F | Resp 20 | Ht 69.0 in | Wt 150.0 lb

## 2022-11-19 DIAGNOSIS — I716 Thoracoabdominal aortic aneurysm, without rupture, unspecified: Secondary | ICD-10-CM | POA: Diagnosis not present

## 2022-11-23 ENCOUNTER — Other Ambulatory Visit: Payer: Self-pay | Admitting: Cardiology

## 2022-11-30 ENCOUNTER — Other Ambulatory Visit: Payer: Self-pay

## 2022-11-30 DIAGNOSIS — I7162 Paravisceral aneurysm of the thoracoabdominal aorta, without rupture: Secondary | ICD-10-CM

## 2022-11-30 DIAGNOSIS — I716 Thoracoabdominal aortic aneurysm, without rupture, unspecified: Secondary | ICD-10-CM

## 2022-12-20 DIAGNOSIS — N319 Neuromuscular dysfunction of bladder, unspecified: Secondary | ICD-10-CM | POA: Diagnosis not present

## 2022-12-20 DIAGNOSIS — R339 Retention of urine, unspecified: Secondary | ICD-10-CM | POA: Diagnosis not present

## 2022-12-24 ENCOUNTER — Other Ambulatory Visit: Payer: Self-pay | Admitting: Cardiology

## 2022-12-24 DIAGNOSIS — E7800 Pure hypercholesterolemia, unspecified: Secondary | ICD-10-CM

## 2022-12-28 ENCOUNTER — Other Ambulatory Visit: Payer: Self-pay | Admitting: Cardiology

## 2022-12-28 ENCOUNTER — Encounter: Payer: Self-pay | Admitting: Vascular Surgery

## 2022-12-28 DIAGNOSIS — I6523 Occlusion and stenosis of bilateral carotid arteries: Secondary | ICD-10-CM

## 2022-12-28 NOTE — Progress Notes (Signed)
ICD-10-CM   1. Asymptomatic bilateral carotid artery stenosis  I65.23 PCV CAROTID DUPLEX (BILATERAL)

## 2023-01-06 ENCOUNTER — Ambulatory Visit: Payer: Medicare HMO

## 2023-01-06 DIAGNOSIS — I6523 Occlusion and stenosis of bilateral carotid arteries: Secondary | ICD-10-CM

## 2023-01-13 DIAGNOSIS — Z800 Family history of malignant neoplasm of digestive organs: Secondary | ICD-10-CM | POA: Diagnosis not present

## 2023-01-13 DIAGNOSIS — Z86010 Personal history of colonic polyps: Secondary | ICD-10-CM | POA: Diagnosis not present

## 2023-01-13 DIAGNOSIS — I77819 Aortic ectasia, unspecified site: Secondary | ICD-10-CM | POA: Diagnosis not present

## 2023-01-17 DIAGNOSIS — R339 Retention of urine, unspecified: Secondary | ICD-10-CM | POA: Diagnosis not present

## 2023-01-17 DIAGNOSIS — N319 Neuromuscular dysfunction of bladder, unspecified: Secondary | ICD-10-CM | POA: Diagnosis not present

## 2023-01-18 ENCOUNTER — Other Ambulatory Visit: Payer: Self-pay | Admitting: Cardiology

## 2023-01-18 DIAGNOSIS — R002 Palpitations: Secondary | ICD-10-CM

## 2023-01-28 ENCOUNTER — Other Ambulatory Visit: Payer: Self-pay | Admitting: Cardiology

## 2023-01-28 DIAGNOSIS — E7800 Pure hypercholesterolemia, unspecified: Secondary | ICD-10-CM | POA: Diagnosis not present

## 2023-01-29 ENCOUNTER — Encounter: Payer: Self-pay | Admitting: Cardiology

## 2023-01-29 LAB — LIPID PANEL WITH LDL/HDL RATIO
Cholesterol, Total: 165 mg/dL (ref 100–199)
HDL: 46 mg/dL (ref >39)
LDL Chol Calc (NIH): 100 mg/dL — ABNORMAL HIGH (ref 0–99)
LDL/HDL Ratio: 2.2 ratio (ref 0.0–3.6)
Triglycerides: 104 mg/dL (ref 0–149)
VLDL Cholesterol Cal: 19 mg/dL (ref 5–40)

## 2023-01-29 NOTE — Progress Notes (Signed)
"  Prevail-ASCVD" study using CholestrylEsther transfer protein inhibitorObecertrapib 10 mg daily in patients with ASCVD on maximal lipid-lowering therapy, LDL >80 mg and CV risk reduction.

## 2023-01-31 ENCOUNTER — Telehealth: Payer: Self-pay

## 2023-01-31 NOTE — Telephone Encounter (Signed)
I will keep that in my mind, when I see him I will discuss further.

## 2023-01-31 NOTE — Telephone Encounter (Signed)
From patient

## 2023-01-31 NOTE — Telephone Encounter (Signed)
Patient wife is asking why no additional labs have NOT been ordered for her husband especially since he is on a high dose of statin.  He has an appointment tomorrow.

## 2023-02-01 ENCOUNTER — Encounter: Payer: Self-pay | Admitting: Cardiology

## 2023-02-01 ENCOUNTER — Ambulatory Visit: Payer: Medicare HMO | Admitting: Cardiology

## 2023-02-01 VITALS — BP 138/82 | HR 64 | Ht 69.0 in | Wt 152.0 lb

## 2023-02-01 DIAGNOSIS — I739 Peripheral vascular disease, unspecified: Secondary | ICD-10-CM

## 2023-02-01 DIAGNOSIS — I10 Essential (primary) hypertension: Secondary | ICD-10-CM | POA: Diagnosis not present

## 2023-02-01 DIAGNOSIS — E7800 Pure hypercholesterolemia, unspecified: Secondary | ICD-10-CM

## 2023-02-01 DIAGNOSIS — I7143 Infrarenal abdominal aortic aneurysm, without rupture: Secondary | ICD-10-CM

## 2023-02-01 DIAGNOSIS — I6523 Occlusion and stenosis of bilateral carotid arteries: Secondary | ICD-10-CM | POA: Diagnosis not present

## 2023-02-01 MED ORDER — LISINOPRIL-HYDROCHLOROTHIAZIDE 10-12.5 MG PO TABS: 1.0000 | ORAL_TABLET | ORAL | 2 refills | Status: DC

## 2023-02-01 NOTE — Progress Notes (Signed)
Primary Physician/Referring:  Lurline Del, DO  Patient ID: Ian [name], male    DOB: 1951/09/29, 72 y.o.   MRN: [id number]  Chief Complaint  Patient presents with   Claudication in peripheral vascular disease [facility])   HPI:    Ian [name]  is a 72 y.o. Caucasian male with mildly abnormal nuclear stress test in 2022, low risk, asymptomatic carotid artery stenosis, hypertension, hyperlipidemia, peripheral artery disease with Dopplers revealing left SFA high-grade stenosis, abdominal aortic aneurysm being followed by vascular surgery presents here for annual follow-up.  He is presently doing with, except for left knee pain and states that this is due to arthritis and denies symptoms suggestive of claudication in his calves. He denies any other symptoms.  He is presently doing self-catheterization for atonic bladder.   Past Medical History:  Diagnosis Date   Chronic kidney disease    Hypercholesteremia    Past Surgical History:  Procedure Laterality Date   NOSE SURGERY     Family History  Problem Relation Age of Onset   Colon cancer Mother    Bladder Cancer Father     Social History   Tobacco Use   Smoking status: Former    Packs/day: 0.50    Years: 40.00    Total pack years: 20.00    Types: Cigarettes    Quit date: 2015    Years since quitting: 9.1   Smokeless tobacco: Never  Substance Use Topics   Alcohol use: Yes    Comment: occ   Marital Status: Married  ROS  Review of Systems  Cardiovascular:  Negative for chest pain, claudication, dyspnea on exertion and leg swelling.  Genitourinary:  Positive for hesitancy (self cath).   Objective  Blood pressure 138/82, pulse 64, height '5\' 9"'$  (1.753 m), weight 152 lb (68.9 kg), SpO2 98 %. Body mass index is 22.45 kg/m.     02/01/2023    2:18 PM 11/19/2022   10:42 AM 04/30/2022   10:13 AM  Vitals with BMI  Height '5\' 9"'$  '5\' 9"'$  '5\' 9"'$   Weight 152 lbs 150 lbs 144 lbs 14 oz  BMI 22.44 [id number]  Systolic 0000000  [id number]  Diastolic 82 80 71  Pulse 64 53 55    Physical Exam Neck:     Vascular: Carotid bruit (left) present. No JVD.  Cardiovascular:     Rate and Rhythm: Normal rate and regular rhythm.     Pulses:          Femoral pulses are 2+ on the right side and 0 on the left side.      Popliteal pulses are 2+ on the right side and 0 on the left side.       Dorsalis pedis pulses are 1+ on the right side and 0 on the left side.       Posterior tibial pulses are 0 on the right side and 0 on the left side.     Heart sounds: Normal heart sounds. No murmur heard.    No gallop.  Pulmonary:     Effort: Pulmonary effort is normal.     Breath sounds: Normal breath sounds.  Abdominal:     General: Bowel sounds are normal.     Palpations: Abdomen is soft.  Musculoskeletal:     Right lower leg: No edema.     Left lower leg: No edema.     Comments: Left leg cooler. Hair loss bilateral legs. Capillary refill reduced to > 3 Sec left  leg.     Laboratory examination:   Lipid Panel Recent Labs    01/28/23 1107  CHOL 165  TRIG 104  LDLCALC 100*  HDL 46    Lipid Panel     Component Value Date/Time   CHOL 165 01/28/2023 1107   TRIG 104 01/28/2023 1107   HDL 46 01/28/2023 1107   CHOLHDL 3.2 09/27/2019 1051   LDLCALC 100 (H) 01/28/2023 1107   LABVLDL 19 01/28/2023 1107    External labs:   Labs 08/20/2022:  Serum glucose '1 1 7 '$ mg, BUN 23, creatinine 1.16, EGFR 68 mL, potassium 5.6.  LFTs normal.  A1c 6.2%.  Labs 08/19/2022:  Total cholesterol 150, triglycerides 124, HDL 46, LDL 81. Non-HDL cholesterol 104.  Serum glucose '1 1 7 '$ mg, BUN 23, creatinine 1.16, EGFR 68. mL, potassium 5.6. LFTs normal.    Medications and allergies  No Known Allergies   Medication list    Current Outpatient Medications  Medication Instructions   aspirin EC 81 mg, Oral, Daily   atenolol (TENORMIN) 25 mg, Oral, Daily   Coenzyme Q10 (CO Q10) 100 MG CAPS 1 capsule, Oral, Daily   ezetimibe (ZETIA) 10 MG  tablet TAKE 1 TABLET BY MOUTH DAILY AFTER SUPPER   lisinopril-hydrochlorothiazide (ZESTORETIC) 10-12.5 MG tablet 1 tablet, Oral, BH-each morning   rosuvastatin (CRESTOR) 40 mg, Oral, Daily   Radiology:   CT angiogram of the abdomen and pelvis with and without contrast 05/05/2021: Comparison 05/08/2020. 1. Stable 3.1 cm aortic arch and 4.8 cm juxtarenal/infrarenal aortic aneurysms. Recommend follow-up CT/MR every 6 months (the patient is currently under the care of a vascular specialist/surgeon). 2. 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 3. Left renal artery stenosis of possible hemodynamic significance. 4. Coronary and Aortic Atherosclerosis (ICD10-170.0). 5.  Emphysema (ICD10-J43.9). 6. Prostate enlargement with trabeculated bladder suggesting a degree of bladder outlet obstruction. 7. Colonic diverticulosis.  Cardiac Studies:   Lower Extremity Arterial Duplex 11/23/2021: No hemodynamically significant stenoses are identified in the right lower extremity arterial system.  Significant velocity increase at the left mid superficial femoral artery suggests >50% stenosis. This exam reveals normal perfusion of the right lower extremity (ABI 1.20) and normal perfusion of the left lower extremity (ABI 1.05).  Abdominal Aortic Duplex 11/06/2021: Abdominal Aorta: There is evidence of abnormal dilatation of the mid Abdominal aorta. The largest aortic measurement is 5.2 cm. The largest aortic diameter has increased compared to prior exam (CTA). Previous diameter measurement was 4.8 cm obtained on 05/05/2021.  PCV MYOCARDIAL PERFUSION WO LEXISCAN 11/18/2021  Narrative Lexiscan (with Mod Bruce protocol) Nuclear stress test 11/18/2021: Non-diagnostic ECG stress. There is a small sized severe reversible severe defect in the apical region. Overall LV systolic function is normal without regional wall motion abnormalities. Stress LV EF: 50%. No previous exam available for comparison.  Intermediate risk study in view of low normal LVEF and apical ischemia.    PCV ECHOCARDIOGRAM COMPLETE 11/23/2021  Narrative Echocardiogram 11/23/2021: Left ventricle cavity is normal in size and wall thickness. Normal global wall motion. Normal LV systolic function with visual EF 50-55%. Doppler evidence of grade I (impaired) diastolic dysfunction. Mild (Grade I) mitral regurgitation. Proximal ascending aorta measures 3.7 cm, which is normal. Mild biatrial dilatation seen on study in 2018 not seen on this study.    Carotid artery duplex 01/06/2023: Duplex suggests stenosis in the right internal carotid artery (16-49%).  < 50% stenosis in the right common carotid artery. <50%  stenosis in the right external carotid  artery. Duplex suggests stenosis in the left internal carotid artery (minimal). Antegrade right vertebral artery flow. Antegrade left vertebral artery flow. Compared to the study done on 12/22/2021, there is mild regression of disease severity on the right from >50%. Follow up in one year is appropriate if clinically indicated.   EKG:   EKG 02/01/2023: Normal sinus rhythm at the rate of 58 bpm, left axis deviation, left anterior fascicular block.  Incomplete right bundle branch block.  Early R wave progression, consider RVH versus true posterior myocardial infarction.  Compared to 01/06/2021, no change.  Assessment     ICD-10-CM   1. Claudication in peripheral vascular disease (HCC)  I73.9     2. Pure hypercholesterolemia  E78.00 Lipoprotein A (LPA)    3. Primary hypertension  I10 EKG [id number]    Basic metabolic panel    4. Asymptomatic bilateral carotid artery stenosis  I65.23 PCV CAROTID DUPLEX (BILATERAL)    5. 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    Dispense:  30 tablet    Refill:  2    Stop losartan   Orders Placed This Encounter  Procedures   Basic metabolic panel   Lipoprotein A (LPA)   EKG 12-Lead   Recommendations:   Ian [name] is a 72 y.o. Caucasian male with mildly abnormal nuclear stress test in 2022, low risk, asymptomatic carotid artery stenosis, hypertension, hyperlipidemia, peripheral artery disease with Dopplers revealing left SFA high-grade stenosis, abdominal aortic aneurysm being followed by vascular surgery presents here for annual follow-up.  1. Claudication in peripheral vascular disease ([place]) Patient symptoms of left knee pain is probably indicating of claudication than true arthritis.  On physical exam, he has no crepitus, he has full extension capability of left knee.  However at this time patient would like to continue observation, if indeed he would like to pursue peripheral arteriogram we could certainly set this up.  There is no critical limb ischemia, otherwise except for reduced physical activity due to left knee pain he has no other symptoms.  2. Pure hypercholesterolemia I reviewed the results of the lipid profile testing, LDL is >70.  He is presently on 40 mg of rosuvastatin along with 10 mg of Zetia, his only other option would be either addition of bempedoic acid or switching him to Repatha, PCSK9 inhibitor.  Other option will include enrolling himself in the clinical trial.  I given him these options, he would like to presently continue the medications he is on and if he decides to change his mind he will contact us.  3. Primary hypertension Blood pressure is well-controlled.  He has been on losartan both from abdominal aortic aneurysm standpoint, PAD standpoint and hypertension.  However we have not been able to increase the dose of the  losartan in view of hyperkalemia.  I will discontinue losartan and switch him to lisinopril HCT 10/12.5 mg in the morning, will obtain BMP along with LPA in 2 to 3 weeks after making the medication changes.  If he is tolerating this well and potassium levels do not go up then we can do 90-day prescriptions.  4. Asymptomatic bilateral carotid artery stenosis He has mild bilateral carotid artery stenosis, will continue annual surveillance.  5. 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  Overall stable from cardiac standpoint, I will see him back in a year or sooner if problems.    Adrian Prows, MD, [facility] 02/01/2023, 3:19 PM Office: [phone]

## 2023-02-12 ENCOUNTER — Encounter: Payer: Self-pay | Admitting: Cardiology

## 2023-02-14 NOTE — Telephone Encounter (Signed)
From patient.

## 2023-02-15 ENCOUNTER — Other Ambulatory Visit: Payer: Self-pay

## 2023-02-15 DIAGNOSIS — E7800 Pure hypercholesterolemia, unspecified: Secondary | ICD-10-CM

## 2023-02-15 MED ORDER — ROSUVASTATIN CALCIUM 40 MG PO TABS: 40.0000 mg | ORAL_TABLET | Freq: Every day | ORAL | 3 refills | Status: DC

## 2023-02-21 DIAGNOSIS — R339 Retention of urine, unspecified: Secondary | ICD-10-CM | POA: Diagnosis not present

## 2023-02-21 DIAGNOSIS — N319 Neuromuscular dysfunction of bladder, unspecified: Secondary | ICD-10-CM | POA: Diagnosis not present

## 2023-03-03 DIAGNOSIS — E7800 Pure hypercholesterolemia, unspecified: Secondary | ICD-10-CM | POA: Diagnosis not present

## 2023-03-03 DIAGNOSIS — I10 Essential (primary) hypertension: Secondary | ICD-10-CM | POA: Diagnosis not present

## 2023-03-05 LAB — BASIC METABOLIC PANEL
BUN/Creatinine Ratio: 20 (ref 10–24)
BUN: 24 mg/dL (ref 8–27)
CO2: 20 mmol/L (ref 20–29)
Calcium: 9.7 mg/dL (ref 8.6–10.2)
Chloride: 101 mmol/L (ref 96–106)
Creatinine, Ser: 1.22 mg/dL (ref 0.76–1.27)
Glucose: 114 mg/dL — ABNORMAL HIGH (ref 70–99)
Potassium: 4.9 mmol/L (ref 3.5–5.2)
Sodium: 139 mmol/L (ref 134–144)
eGFR: 64 mL/min/{1.73_m2} (ref >59)

## 2023-03-05 LAB — LIPOPROTEIN A (LPA): Lipoprotein (a): 30.6 nmol/L (ref <75.0)

## 2023-03-05 NOTE — Progress Notes (Signed)
Lipoprotein (a)  <75.0 nmol/L                     30.6  I will discuss in office visit soon.

## 2023-03-15 ENCOUNTER — Encounter: Payer: Self-pay | Admitting: Cardiology

## 2023-03-16 ENCOUNTER — Encounter: Payer: Self-pay | Admitting: Cardiology

## 2023-03-16 DIAGNOSIS — K5730 Diverticulosis of large intestine without perforation or abscess without bleeding: Secondary | ICD-10-CM | POA: Diagnosis not present

## 2023-03-16 DIAGNOSIS — K649 Unspecified hemorrhoids: Secondary | ICD-10-CM | POA: Diagnosis not present

## 2023-03-16 DIAGNOSIS — Z09 Encounter for follow-up examination after completed treatment for conditions other than malignant neoplasm: Secondary | ICD-10-CM | POA: Diagnosis not present

## 2023-03-16 DIAGNOSIS — Z86010 Personal history of colonic polyps: Secondary | ICD-10-CM | POA: Diagnosis not present

## 2023-03-16 NOTE — Progress Notes (Signed)
Patient is presently on colonoscopy 1424: Diverticulosis of the colon.  External hemorrhoids.

## 2023-03-17 DIAGNOSIS — E559 Vitamin D deficiency, unspecified: Secondary | ICD-10-CM | POA: Diagnosis not present

## 2023-03-17 DIAGNOSIS — Z0000 Encounter for general adult medical examination without abnormal findings: Secondary | ICD-10-CM | POA: Diagnosis not present

## 2023-03-17 DIAGNOSIS — Z125 Encounter for screening for malignant neoplasm of prostate: Secondary | ICD-10-CM | POA: Diagnosis not present

## 2023-03-17 DIAGNOSIS — I714 Abdominal aortic aneurysm, without rupture, unspecified: Secondary | ICD-10-CM | POA: Diagnosis not present

## 2023-03-17 DIAGNOSIS — I10 Essential (primary) hypertension: Secondary | ICD-10-CM | POA: Diagnosis not present

## 2023-03-17 DIAGNOSIS — Z6821 Body mass index (BMI) 21.0-21.9, adult: Secondary | ICD-10-CM | POA: Diagnosis not present

## 2023-03-21 ENCOUNTER — Telehealth: Payer: Self-pay

## 2023-03-21 NOTE — Telephone Encounter (Signed)
Returned pt's wife's call-no answer. LVM to call back if assistance is still needed.

## 2023-03-23 ENCOUNTER — Encounter: Payer: Self-pay | Admitting: Cardiology

## 2023-03-24 NOTE — Telephone Encounter (Signed)
From patient.

## 2023-03-25 ENCOUNTER — Encounter: Payer: Self-pay | Admitting: Vascular Surgery

## 2023-03-28 ENCOUNTER — Encounter: Payer: Self-pay | Admitting: Cardiology

## 2023-03-28 DIAGNOSIS — R339 Retention of urine, unspecified: Secondary | ICD-10-CM | POA: Diagnosis not present

## 2023-03-28 DIAGNOSIS — N319 Neuromuscular dysfunction of bladder, unspecified: Secondary | ICD-10-CM | POA: Diagnosis not present

## 2023-03-28 NOTE — Telephone Encounter (Signed)
From patient.

## 2023-04-05 DIAGNOSIS — R338 Other retention of urine: Secondary | ICD-10-CM | POA: Diagnosis not present

## 2023-04-13 DIAGNOSIS — H2513 Age-related nuclear cataract, bilateral: Secondary | ICD-10-CM | POA: Diagnosis not present

## 2023-04-29 ENCOUNTER — Encounter: Payer: Self-pay | Admitting: Cardiology

## 2023-04-29 ENCOUNTER — Other Ambulatory Visit: Payer: Self-pay | Admitting: Cardiology

## 2023-04-29 NOTE — Telephone Encounter (Signed)
From patient.

## 2023-05-05 DIAGNOSIS — R339 Retention of urine, unspecified: Secondary | ICD-10-CM | POA: Diagnosis not present

## 2023-05-05 DIAGNOSIS — N319 Neuromuscular dysfunction of bladder, unspecified: Secondary | ICD-10-CM | POA: Diagnosis not present

## 2023-05-19 NOTE — Progress Notes (Signed)
Office Note     HPI: KHOBE ELLENBECKER is a 72 y.o. (10/13/1951) male presenting for 78-month follow-up with known type IV thoracoabdominal aneurysm.  1 year ago, the aneurysm was measuring 4.9cm at its largest diameter in the infrarenal portion when measured on CT. Duplex ultrasound demonstrated 5.2cm.  Today, Laguan was doing well, accompanied by his wife.  He has had no issues over the last six months.  Denies new onset back pain, abdominal pain, chest pain.  He denies symptoms of claudication, ischemic rest pain, tissue loss in the lower extremities.  The pt is on a statin for cholesterol management.  The pt is on a daily aspirin.   Other AC:  - The pt is on medication for hypertension.    Past Medical History:  Diagnosis Date   Chronic kidney disease    Hypercholesteremia     Past Surgical History:  Procedure Laterality Date   NOSE SURGERY      Social History   Socioeconomic History   Marital status: Married    Spouse name: Not on file   Number of children: 0   Years of education: 16   Highest education level: Bachelor's degree (e.g., BA, AB, BS)  Occupational History   Occupation: Quality control  Tobacco Use   Smoking status: Former    Packs/day: 0.50    Years: 40.00    Additional pack years: 0.00    Total pack years: 20.00    Types: Cigarettes    Quit date: 2015    Years since quitting: 9.4   Smokeless tobacco: Never  Vaping Use   Vaping Use: Never used  Substance and Sexual Activity   Alcohol use: Yes    Comment: occ   Drug use: No   Sexual activity: Not on file  Other Topics Concern   Not on file  Social History Narrative   Lives at home with his wife.   Right-handed.   No daily use of caffeine.   Social Determinants of Health   Financial Resource Strain: Not on file  Food Insecurity: Not on file  Transportation Needs: Not on file  Physical Activity: Not on file  Stress: Not on file  Social Connections: Not on file  Intimate Partner  Violence: Not on file   Family History  Problem Relation Age of Onset   Colon cancer Mother    Bladder Cancer Father     Current Outpatient Medications  Medication Sig Dispense Refill   aspirin EC 81 MG tablet Take 1 tablet (81 mg total) by mouth daily. 90 tablet 3   atenolol (TENORMIN) 25 MG tablet TAKE 1 TABLET (25 MG TOTAL) BY MOUTH DAILY. (Patient taking differently: Take 25 mg by mouth every evening.) 100 tablet 3   Coenzyme Q10 (CO Q10) 100 MG CAPS Take 1 capsule by mouth daily.     ezetimibe (ZETIA) 10 MG tablet TAKE 1 TABLET BY MOUTH DAILY AFTER SUPPER 90 tablet 3   lisinopril-hydrochlorothiazide (ZESTORETIC) 10-12.5 MG tablet TAKE 1 TABLET BY MOUTH EVERY DAY IN THE MORNING 90 tablet 3   rosuvastatin (CRESTOR) 40 MG tablet Take 1 tablet (40 mg total) by mouth daily. 90 tablet 3   No current facility-administered medications for this visit.    No Known Allergies   REVIEW OF SYSTEMS:  [X]  denotes positive finding, [ ]  denotes negative finding Cardiac  Comments:  Chest pain or chest pressure:    Shortness of breath upon exertion:    Short of breath  when lying flat:    Irregular heart rhythm:        Vascular    Pain in calf, thigh, or hip brought on by ambulation:    Pain in feet at night that wakes you up from your sleep:     Blood clot in your veins:    Leg swelling:         Pulmonary    Oxygen at home:    Productive cough:     Wheezing:         Neurologic    Sudden weakness in arms or legs:     Sudden numbness in arms or legs:     Sudden onset of difficulty speaking or slurred speech:    Temporary loss of vision in one eye:     Problems with dizziness:         Gastrointestinal    Blood in stool:     Vomited blood:         Genitourinary    Burning when urinating:     Blood in urine:        Psychiatric    Major depression:         Hematologic    Bleeding problems:    Problems with blood clotting too easily:        Skin    Rashes or ulcers:         Constitutional    Fever or chills:      PHYSICAL EXAMINATION:  There were no vitals filed for this visit.  General:  WDWN in NAD; vital signs documented above Gait: Not observed HENT: WNL, normocephalic Pulmonary: normal non-labored breathing , without wheezing Cardiac: regular HR Abdomen: soft, NT, no masses Skin: without rashes Vascular Exam/Pulses:  Right Left  Radial 2+ (normal) 2+ (normal)              DP 2+ (normal) Nonpalpable       Extremities: without ischemic changes, without Gangrene , without cellulitis; small open wound on the left heel after cutting it yesterday. Musculoskeletal: no muscle wasting or atrophy  Neurologic: A&O X 3;  No focal weakness or paresthesias are detected Psychiatric:  The pt has Normal affect.   Non-Invasive Vascular Imaging:    Abdominal Aorta Findings:  +-----------+-------+----------+----------+--------+--------+--------+  Location  AP (cm)Trans (cm)PSV (cm/s)WaveformThrombusComments  +-----------+-------+----------+----------+--------+--------+--------+  Proximal  2.81   3.02      91        biphasic                  +-----------+-------+----------+----------+--------+--------+--------+  Mid       5.66   5.71      37        biphasicPresent           +-----------+-------+----------+----------+--------+--------+--------+  Distal    3.35   3.73                                          +-----------+-------+----------+----------+--------+--------+--------+  RT CIA Prox1.5    1.8       220       biphasic                  +-----------+-------+----------+----------+--------+--------+--------+  RT CIA Mid 1.1    1.5       91        biphasic                  +-----------+-------+----------+----------+--------+--------+--------+  ASSESSMENT/PLAN: DAIJON PIERCEFIELD is a 72 y.o. male presenting with known pararenal aortic aneurysm.  This aneurysm has enlarged over the last 6 months, to  greater than 5.5cm.  Current measurement 5.7 cm on ultrasound.  I had a long discussion with Beric and his wife.  He would benefit from repeat CTA to further define the anatomy of the aneurysm.  On previous CT, this was aneurysmal to the level of the superior mesenteric artery , which will likely require either physician modified endograft versus open repair.    I have ordered a CT scan, to be completed in the next week.  I will call him in 2 weeks to discuss what I can offer, and I have also referred him to Dr. Pattricia Boss at [facility] to discuss surgical options.   We discussed the and symptoms of rupture.  I asked him to seek immediate medical attention should any of these occur, and ensure the ED understands that he has an abdominal aortic aneurysm.   Patient's lower extremity peripheral arterial disease and carotids are being followed by Dr. Yates Decamp at this time. I will make sure he receives a copy of this note and has a referral for preop clearance.    Victorino Sparrow, MD [facility] [phone]

## 2023-05-20 ENCOUNTER — Encounter: Payer: Self-pay | Admitting: Cardiology

## 2023-05-20 ENCOUNTER — Encounter: Payer: Self-pay | Admitting: Vascular Surgery

## 2023-05-20 ENCOUNTER — Ambulatory Visit: Payer: Medicare HMO | Admitting: Vascular Surgery

## 2023-05-20 ENCOUNTER — Other Ambulatory Visit (HOSPITAL_COMMUNITY): Payer: Medicare HMO

## 2023-05-20 ENCOUNTER — Ambulatory Visit (HOSPITAL_COMMUNITY)
Admission: RE | Admit: 2023-05-20 | Discharge: 2023-05-20 | Disposition: A | Discharge status: Home / Self Care | Payer: Medicare HMO | Source: Ambulatory Visit | Attending: Vascular Surgery | Admitting: Vascular Surgery

## 2023-05-20 VITALS — BP 133/82 | HR 69 | Temp 98.0°F | Resp 20 | Ht 69.0 in | Wt 147.0 lb

## 2023-05-20 DIAGNOSIS — I716 Thoracoabdominal aortic aneurysm, without rupture, unspecified: Secondary | ICD-10-CM | POA: Diagnosis not present

## 2023-05-20 DIAGNOSIS — I7141 Pararenal abdominal aortic aneurysm, without rupture: Secondary | ICD-10-CM

## 2023-05-20 DIAGNOSIS — I7162 Paravisceral aneurysm of the thoracoabdominal aorta, without rupture: Secondary | ICD-10-CM | POA: Insufficient documentation

## 2023-05-20 NOTE — Telephone Encounter (Signed)
From pt

## 2023-05-21 ENCOUNTER — Encounter: Payer: Self-pay | Admitting: Vascular Surgery

## 2023-05-23 ENCOUNTER — Other Ambulatory Visit: Payer: Self-pay

## 2023-05-23 DIAGNOSIS — I7141 Pararenal abdominal aortic aneurysm, without rupture: Secondary | ICD-10-CM

## 2023-05-25 ENCOUNTER — Encounter: Payer: Self-pay | Admitting: Cardiology

## 2023-05-27 ENCOUNTER — Telehealth: Payer: Self-pay

## 2023-05-27 NOTE — Telephone Encounter (Signed)
Received cardiac cx from Dr. Jacinto Halim (their office scanned a copy of this into Epic). Pt is scheduled to have CT tomorrow and then speak with Dr. Karin Lieu next week to discuss proceeding with surgery.

## 2023-05-28 ENCOUNTER — Ambulatory Visit (HOSPITAL_BASED_OUTPATIENT_CLINIC_OR_DEPARTMENT_OTHER)
Admission: RE | Admit: 2023-05-28 | Discharge: 2023-05-28 | Disposition: A | Discharge status: Home / Self Care | Payer: Medicare HMO | Source: Ambulatory Visit | Attending: Vascular Surgery | Admitting: Vascular Surgery

## 2023-05-28 DIAGNOSIS — I7141 Pararenal abdominal aortic aneurysm, without rupture: Secondary | ICD-10-CM | POA: Diagnosis not present

## 2023-05-28 DIAGNOSIS — I7142 Juxtarenal abdominal aortic aneurysm, without rupture: Secondary | ICD-10-CM | POA: Diagnosis not present

## 2023-05-28 LAB — POCT I-STAT CREATININE: Creatinine, Ser: 1.5 mg/dL — ABNORMAL HIGH (ref 0.61–1.24)

## 2023-05-28 MED ORDER — IOHEXOL 350 MG/ML SOLN
100.0000 mL | Freq: Once | INTRAVENOUS | Status: AC | PRN
  Administered 2023-05-28: 100 mL via INTRAVENOUS

## 2023-05-29 ENCOUNTER — Encounter: Payer: Self-pay | Admitting: Cardiology

## 2023-05-30 NOTE — Telephone Encounter (Signed)
From pt

## 2023-05-31 ENCOUNTER — Encounter: Payer: Self-pay | Admitting: Cardiology

## 2023-06-01 NOTE — Telephone Encounter (Signed)
From pt

## 2023-06-02 ENCOUNTER — Ambulatory Visit (INDEPENDENT_AMBULATORY_CARE_PROVIDER_SITE_OTHER): Payer: Self-pay | Admitting: Vascular Surgery

## 2023-06-02 DIAGNOSIS — I716 Thoracoabdominal aortic aneurysm, without rupture, unspecified: Secondary | ICD-10-CM

## 2023-06-02 NOTE — Progress Notes (Signed)
Patient was called this evening regarding CT results. Aneurysm increased in size from 4.9 cm to 5.1 cm over the last year.  Likely discrepancy from ultrasound due to the shape of the aneurysm. The aneurysm appears to be a type IV thoracoabdominal aneurysm which would require open type IV thoracorepair with left renal artery bypass in my hands.  I have sent Ian [name] to Dr. Pattricia Boss for discussion regarding open surgery versus possible endovascular repair which could not be performed here at [facility].  Should Dr. Danae Orleans recommendations recommend waiting until the aneurysm is reached 5.5 cm, I am happy to see him in the office until the size criteria has been met.  Ian [name] was asked, office should any questions or concerns arise.  My plan is to set him up for 58-month follow-up with ultrasound study.   Victorino Sparrow MD

## 2023-06-06 DIAGNOSIS — R339 Retention of urine, unspecified: Secondary | ICD-10-CM | POA: Diagnosis not present

## 2023-06-06 DIAGNOSIS — N319 Neuromuscular dysfunction of bladder, unspecified: Secondary | ICD-10-CM | POA: Diagnosis not present

## 2023-07-11 DIAGNOSIS — N319 Neuromuscular dysfunction of bladder, unspecified: Secondary | ICD-10-CM | POA: Diagnosis not present

## 2023-07-11 DIAGNOSIS — R339 Retention of urine, unspecified: Secondary | ICD-10-CM | POA: Diagnosis not present

## 2023-07-14 DIAGNOSIS — I7141 Pararenal abdominal aortic aneurysm, without rupture: Secondary | ICD-10-CM | POA: Diagnosis not present

## 2023-07-15 ENCOUNTER — Encounter: Payer: Self-pay | Admitting: Vascular Surgery

## 2023-07-19 ENCOUNTER — Encounter: Payer: Self-pay | Admitting: Cardiology

## 2023-07-20 NOTE — Telephone Encounter (Signed)
From patient.

## 2023-07-26 ENCOUNTER — Encounter: Payer: Self-pay | Admitting: Cardiology

## 2023-07-27 NOTE — Telephone Encounter (Signed)
From patient.

## 2023-08-15 DIAGNOSIS — N319 Neuromuscular dysfunction of bladder, unspecified: Secondary | ICD-10-CM | POA: Diagnosis not present

## 2023-08-15 DIAGNOSIS — R339 Retention of urine, unspecified: Secondary | ICD-10-CM | POA: Diagnosis not present

## 2023-09-14 DIAGNOSIS — R339 Retention of urine, unspecified: Secondary | ICD-10-CM | POA: Diagnosis not present

## 2023-09-14 DIAGNOSIS — N319 Neuromuscular dysfunction of bladder, unspecified: Secondary | ICD-10-CM | POA: Diagnosis not present

## 2023-10-18 DIAGNOSIS — N319 Neuromuscular dysfunction of bladder, unspecified: Secondary | ICD-10-CM | POA: Diagnosis not present

## 2023-10-18 DIAGNOSIS — R339 Retention of urine, unspecified: Secondary | ICD-10-CM | POA: Diagnosis not present

## 2023-11-14 DIAGNOSIS — R339 Retention of urine, unspecified: Secondary | ICD-10-CM | POA: Diagnosis not present

## 2023-11-14 DIAGNOSIS — N319 Neuromuscular dysfunction of bladder, unspecified: Secondary | ICD-10-CM | POA: Diagnosis not present

## 2023-11-15 ENCOUNTER — Telehealth: Payer: Self-pay

## 2023-11-15 NOTE — Telephone Encounter (Signed)
Pt's wife called to ask about f/u with MD here. Per Dr. Karin Lieu, pt is to f/u with Dr. Pattricia Boss now regarding his TAAA. Pt's wife was in agreement and understanding. No further questions/concerns at this time.

## 2023-12-12 DIAGNOSIS — R339 Retention of urine, unspecified: Secondary | ICD-10-CM | POA: Diagnosis not present

## 2023-12-12 DIAGNOSIS — N319 Neuromuscular dysfunction of bladder, unspecified: Secondary | ICD-10-CM | POA: Diagnosis not present

## 2023-12-22 ENCOUNTER — Ambulatory Visit: Payer: Medicare HMO | Admitting: Vascular Surgery

## 2023-12-22 ENCOUNTER — Other Ambulatory Visit (HOSPITAL_COMMUNITY): Payer: Medicare HMO

## 2024-01-12 ENCOUNTER — Telehealth: Payer: Self-pay | Admitting: Cardiology

## 2024-01-12 ENCOUNTER — Ambulatory Visit (HOSPITAL_COMMUNITY)
Admission: RE | Admit: 2024-01-12 | Discharge: 2024-01-12 | Disposition: A | Discharge status: Home / Self Care | Payer: Medicare HMO | Source: Ambulatory Visit | Attending: Cardiology | Admitting: Cardiology

## 2024-01-12 DIAGNOSIS — I6523 Occlusion and stenosis of bilateral carotid arteries: Secondary | ICD-10-CM | POA: Diagnosis not present

## 2024-01-12 DIAGNOSIS — E782 Mixed hyperlipidemia: Secondary | ICD-10-CM

## 2024-01-12 DIAGNOSIS — I779 Disorder of arteries and arterioles, unspecified: Secondary | ICD-10-CM

## 2024-01-12 DIAGNOSIS — I2510 Atherosclerotic heart disease of native coronary artery without angina pectoris: Secondary | ICD-10-CM

## 2024-01-12 DIAGNOSIS — I10 Essential (primary) hypertension: Secondary | ICD-10-CM

## 2024-01-12 NOTE — Telephone Encounter (Signed)
 Patient's wife was calling to know if Dr. Berry Bristol was wanting the patient to have labs completed before his yearly follow up appt. Please advise.

## 2024-01-13 ENCOUNTER — Encounter: Payer: Self-pay | Admitting: Cardiology

## 2024-01-13 ENCOUNTER — Telehealth: Payer: Self-pay | Admitting: Cardiology

## 2024-01-13 NOTE — Telephone Encounter (Signed)
 CM, CBC. Lipids. Diagnosis CAD without angina and hyperchol. If he has not had labs done by his PCP since Apr 2024

## 2024-01-13 NOTE — Telephone Encounter (Signed)
 Wife called in to say that on patient results, it had him listed as a diabatic and he is not one. Calling to see how it can be fixed. Please advise

## 2024-01-13 NOTE — Telephone Encounter (Signed)
 See other phone note

## 2024-01-13 NOTE — Telephone Encounter (Signed)
 Spoke with pt's wife, DPR and advised per Dr Berry Bristol, pt may have labs drawn at any [facility] the first week of April.  Pt is not scheduled for labs with PCP again until 03/27/2024.  Pt's wife verbalizes understanding and agrees with current plan.

## 2024-01-19 DIAGNOSIS — Z79899 Other long term (current) drug therapy: Secondary | ICD-10-CM | POA: Diagnosis not present

## 2024-01-19 DIAGNOSIS — I10 Essential (primary) hypertension: Secondary | ICD-10-CM | POA: Diagnosis not present

## 2024-01-19 DIAGNOSIS — N3289 Other specified disorders of bladder: Secondary | ICD-10-CM | POA: Diagnosis not present

## 2024-01-19 DIAGNOSIS — N323 Diverticulum of bladder: Secondary | ICD-10-CM | POA: Diagnosis not present

## 2024-01-19 DIAGNOSIS — Z7982 Long term (current) use of aspirin: Secondary | ICD-10-CM | POA: Diagnosis not present

## 2024-01-19 DIAGNOSIS — I7789 Other specified disorders of arteries and arterioles: Secondary | ICD-10-CM | POA: Diagnosis not present

## 2024-01-19 DIAGNOSIS — I716 Thoracoabdominal aortic aneurysm, without rupture, unspecified: Secondary | ICD-10-CM | POA: Diagnosis not present

## 2024-01-19 DIAGNOSIS — I7141 Pararenal abdominal aortic aneurysm, without rupture: Secondary | ICD-10-CM | POA: Diagnosis not present

## 2024-01-19 DIAGNOSIS — I7142 Juxtarenal abdominal aortic aneurysm, without rupture: Secondary | ICD-10-CM | POA: Diagnosis not present

## 2024-01-19 DIAGNOSIS — Z87891 Personal history of nicotine dependence: Secondary | ICD-10-CM | POA: Diagnosis not present

## 2024-01-29 ENCOUNTER — Encounter: Payer: Self-pay | Admitting: Cardiology

## 2024-01-30 ENCOUNTER — Encounter: Payer: Self-pay | Admitting: Cardiology

## 2024-02-09 DIAGNOSIS — R339 Retention of urine, unspecified: Secondary | ICD-10-CM | POA: Diagnosis not present

## 2024-02-09 DIAGNOSIS — N319 Neuromuscular dysfunction of bladder, unspecified: Secondary | ICD-10-CM | POA: Diagnosis not present

## 2024-02-15 ENCOUNTER — Other Ambulatory Visit: Payer: Self-pay | Admitting: Cardiology

## 2024-02-15 DIAGNOSIS — R002 Palpitations: Secondary | ICD-10-CM

## 2024-02-23 ENCOUNTER — Encounter: Payer: Self-pay | Admitting: Vascular Surgery

## 2024-02-23 ENCOUNTER — Ambulatory Visit: Payer: Medicare HMO | Admitting: Vascular Surgery

## 2024-02-23 VITALS — BP 145/93 | HR 73 | Temp 97.8°F | Resp 18 | Ht 69.0 in | Wt 149.1 lb

## 2024-02-23 DIAGNOSIS — I716 Thoracoabdominal aortic aneurysm, without rupture, unspecified: Secondary | ICD-10-CM | POA: Diagnosis not present

## 2024-02-23 NOTE — Progress Notes (Signed)
 Office Note     HPI: Ian [name] is a 73 y.o. (May 20, 1951) male presenting for follow-up with known type IV thoracoabdominal aneurysm.   At his last visit, he was referred to Dr. Pattricia Boss at [facility] due to the complexity of the aneurysm and need for fenestrated endovascular aortic repair versus open surgery.  He has since met with Dr. Jimmye Norman on 2 occasions, and asked for another office meeting to discuss his questions.   On exam, Ian [name] was doing well.  He had no complaints.  Denied abdominal, back, chest pain.  Most recent CT demonstrated type IV thoracoabdominal aneurysm measuring 5.6 cm.  At the time of his last visit, he was offered both open or endovascular repair.  He was unsure which to choose.  He denies symptoms of claudication, ischemic rest pain, tissue loss in the lower extremities.   The pt is on a statin for cholesterol management.  The pt is on a daily aspirin.   Other AC:  - The pt is on medication for hypertension.    Past Medical History:  Diagnosis Date   AAA (abdominal aortic aneurysm) (HCC)    Chronic kidney disease    Hypercholesteremia     Past Surgical History:  Procedure Laterality Date   NOSE SURGERY      Social History   Socioeconomic History   Marital status: Married    Spouse name: Not on file   Number of children: 0   Years of education: 16   Highest education level: Bachelor's degree (e.g., BA, AB, BS)  Occupational History   Occupation: Quality control  Tobacco Use   Smoking status: Former    Current packs/day: 0.00    Average packs/day: 0.5 packs/day for 40.0 years (20.0 ttl pk-yrs)    Types: Cigarettes    Start date: 48    Quit date: 2015    Years since quitting: 10.2   Smokeless tobacco: Never  Vaping Use   Vaping status: Never Used  Substance and Sexual Activity   Alcohol use: Yes    Comment: occ   Drug use: No   Sexual activity: Not on file  Other Topics Concern   Not on file  Social History Narrative   Lives  at home with his wife.   Right-handed.   No daily use of caffeine.   Social Drivers of Corporate investment banker Strain: Not on file  Food Insecurity: Not on file  Transportation Needs: Not on file  Physical Activity: Not on file  Stress: Not on file  Social Connections: Not on file  Intimate Partner Violence: Not on file   Family History  Problem Relation Age of Onset   Colon cancer Mother    Bladder Cancer Father     Current Outpatient Medications  Medication Sig Dispense Refill   aspirin EC 81 MG tablet Take 1 tablet (81 mg total) by mouth daily. 90 tablet 3   atenolol (TENORMIN) 25 MG tablet TAKE 1 TABLET (25 MG TOTAL) BY MOUTH DAILY. 90 tablet 0   Coenzyme Q10 (CO Q10) 100 MG CAPS Take 1 capsule by mouth daily.     ezetimibe (ZETIA) 10 MG tablet TAKE 1 TABLET BY MOUTH DAILY AFTER SUPPER 90 tablet 3   rosuvastatin (CRESTOR) 40 MG tablet Take 1 tablet (40 mg total) by mouth daily. 90 tablet 3   No current facility-administered medications for this visit.    No Known Allergies   REVIEW OF SYSTEMS:  [X]  denotes positive  finding, [ ]  denotes negative finding Cardiac  Comments:  Chest pain or chest pressure:    Shortness of breath upon exertion:    Short of breath when lying flat:    Irregular heart rhythm:        Vascular    Pain in calf, thigh, or hip brought on by ambulation:    Pain in feet at night that wakes you up from your sleep:     Blood clot in your veins:    Leg swelling:         Pulmonary    Oxygen at home:    Productive cough:     Wheezing:         Neurologic    Sudden weakness in arms or legs:     Sudden numbness in arms or legs:     Sudden onset of difficulty speaking or slurred speech:    Temporary loss of vision in one eye:     Problems with dizziness:         Gastrointestinal    Blood in stool:     Vomited blood:         Genitourinary    Burning when urinating:     Blood in urine:        Psychiatric    Major depression:          Hematologic    Bleeding problems:    Problems with blood clotting too easily:        Skin    Rashes or ulcers:        Constitutional    Fever or chills:      PHYSICAL EXAMINATION:  Vitals:   02/23/24 1041  BP: (!) 145/93  Pulse: 73  Resp: 18  Temp: 97.8 F (36.6 C)  TempSrc: Temporal  SpO2: 96%  Weight: 149 lb 1.6 oz (67.6 kg)  Height: 5\' 9"  (1.753 m)    General:  WDWN in NAD; vital signs documented above Gait: Not observed HENT: WNL, normocephalic Pulmonary: normal non-labored breathing , without wheezing Cardiac: regular HR Abdomen: soft, NT, no masses Skin: without rashes Vascular Exam/Pulses:  Right Left  Radial 2+ (normal) 2+ (normal)              DP 2+ (normal) Nonpalpable       Extremities: without ischemic changes, without Gangrene , without cellulitis; small open wound on the left heel after cutting it yesterday. Musculoskeletal: no muscle wasting or atrophy  Neurologic: A&O X 3;  No focal weakness or paresthesias are detected Psychiatric:  The pt has Normal affect.   Non-Invasive Vascular Imaging:    Abdominal Aorta Findings:  +-----------+-------+----------+----------+--------+--------+--------+  Location  AP (cm)Trans (cm)PSV (cm/s)WaveformThrombusComments  +-----------+-------+----------+----------+--------+--------+--------+  Proximal  2.81   3.02      91        biphasic                  +-----------+-------+----------+----------+--------+--------+--------+  Mid       5.66   5.71      37        biphasicPresent           +-----------+-------+----------+----------+--------+--------+--------+  Distal    3.35   3.73                                          +-----------+-------+----------+----------+--------+--------+--------+  RT CIA Prox1.5    1.8  220       biphasic                  +-----------+-------+----------+----------+--------+--------+--------+  RT CIA Mid 1.1    1.5       91         biphasic                  +-----------+-------+----------+----------+--------+--------+--------+      ASSESSMENT/PLAN: Ian [name] is a 73 y.o. male presenting with known type IV thoracoabdominal aneurysm.  Current aneurysm size 5.6 cm on most recent measurement.  He has had 1 mm of growth between CT scans.  He has been offered both open and endovascular repair with Dr. Pattricia Boss.  Ian [name] had a list of questions that he wanted to go over, which I was happy to do, with the understanding that Dr. Danae Orleans recommendations supersede mine.  We spent a considerable amount of time discussing whether or not he should pursue aneurysm repair.  One of his major questions was if he wait until 6.0 cm.  He noted that Dr. Pattricia Boss stated that there is a high year mortality in individuals who undergo this operation, and that Dr. Pattricia Boss was okay if he waited until 6.0 cm.  Similarly, I agree with him.  With the complexity of his aneurysm, and the increased morbidity and mortality associated, I think it is reasonable to wait to 6 cm. We discussed that both of the endovascular and open repairs have varying complications, and that should he choose to wait, he may be in poorer health at the time repair is needed.  We spoke for roughly 30 minutes.  He is aware that Dr. Pattricia Boss is the surgeon I would send my father to should he have the same problem.  I told him I am happy to answer any of his questions should they arise, but that operative details should be directed to Dr. Pattricia Boss.  Ian [name] can follow-up with me as needed at this time.   Victorino Sparrow, MD [facility] [phone]

## 2024-02-26 ENCOUNTER — Encounter: Payer: Self-pay | Admitting: Vascular Surgery

## 2024-03-01 ENCOUNTER — Other Ambulatory Visit: Payer: Self-pay | Admitting: Cardiology

## 2024-03-01 DIAGNOSIS — E7800 Pure hypercholesterolemia, unspecified: Secondary | ICD-10-CM

## 2024-03-06 ENCOUNTER — Encounter: Payer: Self-pay | Admitting: Cardiology

## 2024-03-10 DIAGNOSIS — R339 Retention of urine, unspecified: Secondary | ICD-10-CM | POA: Diagnosis not present

## 2024-03-10 DIAGNOSIS — N319 Neuromuscular dysfunction of bladder, unspecified: Secondary | ICD-10-CM | POA: Diagnosis not present

## 2024-03-21 ENCOUNTER — Telehealth: Payer: Self-pay | Admitting: Cardiology

## 2024-03-21 NOTE — Telephone Encounter (Signed)
 Left message for patient that labs have been ordered and released.

## 2024-03-21 NOTE — Telephone Encounter (Signed)
 Pt is going in the morning on 4/17 to have labs done and wants to make sure they are released.

## 2024-03-22 DIAGNOSIS — E782 Mixed hyperlipidemia: Secondary | ICD-10-CM | POA: Diagnosis not present

## 2024-03-22 DIAGNOSIS — I10 Essential (primary) hypertension: Secondary | ICD-10-CM | POA: Diagnosis not present

## 2024-03-22 DIAGNOSIS — I779 Disorder of arteries and arterioles, unspecified: Secondary | ICD-10-CM | POA: Diagnosis not present

## 2024-03-22 DIAGNOSIS — I2510 Atherosclerotic heart disease of native coronary artery without angina pectoris: Secondary | ICD-10-CM | POA: Diagnosis not present

## 2024-03-22 LAB — LIPID PANEL
Chol/HDL Ratio: 2.6 ratio (ref 0.0–5.0)
Cholesterol, Total: 119 mg/dL (ref 100–199)
HDL: 46 mg/dL (ref >39)
LDL Chol Calc (NIH): 53 mg/dL (ref 0–99)
Triglycerides: 111 mg/dL (ref 0–149)
VLDL Cholesterol Cal: 20 mg/dL (ref 5–40)

## 2024-03-23 ENCOUNTER — Encounter: Payer: Self-pay | Admitting: Cardiology

## 2024-03-23 LAB — COMPREHENSIVE METABOLIC PANEL WITH GFR
ALT: 28 IU/L (ref 0–44)
AST: 29 IU/L (ref 0–40)
Albumin: 4.6 g/dL (ref 3.8–4.8)
Alkaline Phosphatase: 72 IU/L (ref 44–121)
BUN/Creatinine Ratio: 17 (ref 10–24)
BUN: 24 mg/dL (ref 8–27)
Bilirubin Total: 0.3 mg/dL (ref 0.0–1.2)
CO2: 17 mmol/L — ABNORMAL LOW (ref 20–29)
Calcium: 9.9 mg/dL (ref 8.6–10.2)
Chloride: 102 mmol/L (ref 96–106)
Creatinine, Ser: 1.42 mg/dL — ABNORMAL HIGH (ref 0.76–1.27)
Globulin, Total: 3.1 g/dL (ref 1.5–4.5)
Glucose: 118 mg/dL — ABNORMAL HIGH (ref 70–99)
Potassium: 5.6 mmol/L — ABNORMAL HIGH (ref 3.5–5.2)
Sodium: 141 mmol/L (ref 134–144)
Total Protein: 7.7 g/dL (ref 6.0–8.5)
eGFR: 53 mL/min/{1.73_m2} — ABNORMAL LOW (ref >59)

## 2024-03-23 LAB — CBC
Hematocrit: 49.4 % (ref 37.5–51.0)
Hemoglobin: 15.2 g/dL (ref 13.0–17.7)
MCH: 29 pg (ref 26.6–33.0)
MCHC: 30.8 g/dL — ABNORMAL LOW (ref 31.5–35.7)
MCV: 94 fL (ref 79–97)
Platelets: 334 10*3/uL (ref 150–450)
RBC: 5.24 x10E6/uL (ref 4.14–5.80)
RDW: 13.1 % (ref 11.6–15.4)
WBC: 5.6 10*3/uL (ref 3.4–10.8)

## 2024-03-23 NOTE — Progress Notes (Signed)
 Your labs show your hemoglobin has improved to normal, renal function/kidney function has remained stable, cholesterol is under excellent control.  Continue present therapy for now.

## 2024-03-24 ENCOUNTER — Encounter: Payer: Self-pay | Admitting: Cardiology

## 2024-03-29 ENCOUNTER — Other Ambulatory Visit: Payer: Self-pay

## 2024-03-29 ENCOUNTER — Encounter: Payer: Self-pay | Admitting: Cardiology

## 2024-03-29 ENCOUNTER — Ambulatory Visit: Payer: Medicare HMO | Attending: Cardiology | Admitting: Cardiology

## 2024-03-29 VITALS — BP 104/74 | HR 60 | Ht 69.0 in | Wt 143.0 lb

## 2024-03-29 DIAGNOSIS — E7800 Pure hypercholesterolemia, unspecified: Secondary | ICD-10-CM

## 2024-03-29 DIAGNOSIS — I7141 Pararenal abdominal aortic aneurysm, without rupture: Secondary | ICD-10-CM | POA: Diagnosis not present

## 2024-03-29 DIAGNOSIS — I10 Essential (primary) hypertension: Secondary | ICD-10-CM | POA: Diagnosis not present

## 2024-03-29 DIAGNOSIS — I739 Peripheral vascular disease, unspecified: Secondary | ICD-10-CM | POA: Diagnosis not present

## 2024-03-29 NOTE — Patient Instructions (Signed)
 Medication Instructions:  Your physician recommends that you continue on your current medications as directed. Please refer to the Current Medication list given to you today.  *If you need a refill on your cardiac medications before your next appointment, please call your pharmacy*  Lab Work: Your physician recommends that you return for lab work in: 1 month for fasting lipid clinic.  If you have labs (blood work) drawn today and your tests are completely normal, you will receive your results only by: MyChart Message (if you have MyChart) OR A paper copy in the mail If you have any lab test that is abnormal or we need to change your treatment, we will call you to review the results.  Testing/Procedures: Your physician has requested that you have an ankle brachial index (ABI). During this test an ultrasound and blood pressure cuff are used to evaluate the arteries that supply the arms and legs with blood. Allow thirty minutes for this exam. There are no restrictions or special instructions.  Please note: We ask at that you not bring children with you during ultrasound (echo/ vascular) testing. Due to room size and safety concerns, children are not allowed in the ultrasound rooms during exams. Our front office staff cannot provide observation of children in our lobby area while testing is being conducted. An adult accompanying a patient to their appointment will only be allowed in the ultrasound room at the discretion of the ultrasound technician under special circumstances. We apologize for any inconvenience.  Your physician has requested that you have a lower extremity arterial duplex. This test is an ultrasound of the arteries in the legs. It looks at arterial blood flow in the legs and arms. Allow one hour for Lower Arterial scans. There are no restrictions or special instructions.  Please note: We ask at that you not bring children with you during ultrasound (echo/ vascular) testing. Due to  room size and safety concerns, children are not allowed in the ultrasound rooms during exams. Our front office staff cannot provide observation of children in our lobby area while testing is being conducted. An adult accompanying a patient to their appointment will only be allowed in the ultrasound room at the discretion of the ultrasound technician under special circumstances. We apologize for any inconvenience.   Follow-Up: At [facility], you and your health needs are our priority.  As part of our continuing mission to provide you with exceptional heart care, our providers are all part of one team.  This team includes your primary Cardiologist (physician) and Advanced Practice Providers or APPs (Physician Assistants and Nurse Practitioners) who all work together to provide you with the care you need, when you need it.  Your next appointment:   1 year(s)  Provider:   Knox Perl, MD    We recommend signing up for the patient portal called "MyChart".  Sign up information is provided on this After Visit Summary.  MyChart is used to connect with patients for Virtual Visits (Telemedicine).  Patients are able to view lab/test results, encounter notes, upcoming appointments, etc.  Non-urgent messages can be sent to your provider as well.   To learn more about what you can do with MyChart, go to [email or web address].   Other Instructions       1st Floor: - Lobby - Registration  - Pharmacy  - Lab - Cafe  2nd Floor: - PV Lab - Diagnostic Testing (echo, CT, nuclear med)  3rd Floor: - Vacant  4th Floor: - TCTS (cardiothoracic  surgery) - AFib Clinic - Structural Heart Clinic - Vascular Surgery  - Vascular Ultrasound  5th Floor: - [facility] (general and EP) - Clinical Pharmacy for coumadin, hypertension, lipid, weight-loss medications, and med management appointments    Valet parking services will be available as well.

## 2024-03-29 NOTE — Progress Notes (Signed)
 Cardiology Office Note:  .   Date:  03/30/2024  ID:  Ian [name], DOB 11-25-1951, MRN [id number] PCP: Mordechai April, DO  [facility] Providers Cardiologist:  Knox Perl, MD   History of Present Illness: .   Ian [name] is a 73 y.o. 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  Discussed the use of AI scribe software for clinical note transcription with the patient, who gave verbal consent to proceed.  History of Present Illness The patient, with a history of peripheral arterial disease (PAD), high cholesterol, high blood pressure, and an abdominal aortic aneurysm, presents for a routine follow-up visit. He reports no new complaints and is currently on Crestor , Zetia , and Atenolol  for these conditions. The patient mentions feeling heaviness in the legs after walking several hundred yards, but it does not stop him from being active. He has been following up with a specialist for the abdominal aortic aneurysm every six months. The patient also notes a discrepancy in his LDL cholesterol levels between two recent lab results, but he has been adherent to his cholesterol medications.  Labs   Lab Results  Component Value Date   CHOL 119 03/22/2024   HDL 46 03/22/2024   LDLCALC 53 03/22/2024   TRIG 111 03/22/2024   CHOLHDL 2.6 03/22/2024   Lab Results  Component Value Date   NA 141 03/22/2024   K 5.6 (H) 03/22/2024   CO2 17 (L) 03/22/2024   GLUCOSE 118 (H) 03/22/2024   BUN 24 03/22/2024   CREATININE 1.42 (H) 03/22/2024   CALCIUM  9.9 03/22/2024   EGFR 53 (L) 03/22/2024   GFRNONAA 62 09/27/2019      Latest Ref Rng & Units 03/22/2024   10:12  AM 05/28/2023    5:28 PM 03/03/2023    1:47 PM  BMP  Glucose 70 - 99 mg/dL 409   811   BUN 8 - 27 mg/dL 24   24   Creatinine 9.14 - 1.27 mg/dL 7.82  9.56  2.13   BUN/Creat Ratio 10 - 24 17   20    Sodium 134 - 144 mmol/L 141   139   Potassium 3.5 - 5.2 mmol/L 5.6   4.9   Chloride 96 - 106 mmol/L 102   101   CO2 20 - 29 mmol/L 17   20   Calcium  8.6 - 10.2 mg/dL 9.9   9.7       Latest Ref Rng & Units 03/22/2024   10:12 AM 07/05/2019    1:42 PM  CBC  WBC 3.4 - 10.8 x10E3/uL 5.6  7.4   Hemoglobin 13.0 - 17.7 g/dL 08.6  57.8   Hematocrit 37.5 - 51.0 % 49.4  39.5   Platelets 150 - 450 x10E3/uL 334  262    External Labs:  PCP labs for 22 2025:  Serum glucose 104 mg, BUN 25, creatinine 1.34, EGFR 56 mL, K 4.6, LFTs normal.  Total cholesterol 157, triglycerides 93, HDL 49, LDL 91.  A1c 6.6%.  Vitamin D49.1.  Review of Systems  Cardiovascular:  Negative for chest pain, dyspnea on exertion and leg swelling.   Physical Exam:   VS:  BP 104/74 (BP Location: Left Arm, Patient Position: Sitting, Cuff Size: Normal)   Pulse 60   Ht  5\' 9"  (1.753 m)   Wt 143 lb (64.9 kg)   SpO2 98%   BMI 21.12 kg/m    Wt Readings from Last 3 Encounters:  03/29/24 143 lb (64.9 kg)  02/23/24 149 lb 1.6 oz (67.6 kg)  05/20/23 147 lb (66.7 kg)    Physical Exam Neck:     Vascular: Carotid bruit (right) present. No JVD.  Cardiovascular:     Rate and Rhythm: Normal rate and regular rhythm.     Pulses:          Femoral pulses are 2+ on the right side and 2+ on the left side.      Popliteal pulses are 2+ on the right side and 1+ on the left side.       Dorsalis pedis pulses are 0 on the right side and 0 on the left side.       Posterior tibial pulses are 0 on the right side and 0 on the left side.     Heart sounds: Normal heart sounds. No murmur heard.    No gallop.  Pulmonary:     Effort: Pulmonary effort is normal.     Breath sounds: Normal breath sounds.  Abdominal:     General: Bowel sounds are  normal.     Palpations: Abdomen is soft.  Musculoskeletal:     Right lower leg: No edema.     Left lower leg: No edema.    Studies Reviewed: .    Carotid artery duplex 01/12/2024: Right ICA 40 to 59% stenosis, left 1 to 39% stenosis with heterogenous plaque. Bilateral vertebral arteries reveal antegrade flow.  Left subclavian artery flow is disturbed. No significant change from 01/06/2023. Recheck in 1 year.   Abdominal attic duplex 05/20/2023: Large infrarenal abdominal aortic aneurysm measuring 5.7 cm which is increased from 5.3 cm compared to 11/19/2022.  CT angiogram of the abdomen and pelvis 05/28/2023: Continued increased size of juxtarenal abdominal attic aneurysm now measures 5.1 cm compared to 4.7 cm on 04/19/2022.  Recommend follow-up in 3 to 6 months. Predominantly noncalcified mural thrombus within the distal complement of the aneurysm. 50% bilateral renal artery stenosis associated with asymmetric renal atrophy or delayed renal enhancement. EKG:    EKG Interpretation Date/Time:  Thursday March 29 2024 13:35:51 EDT Ventricular Rate:  60 PR Interval:  172 QRS Duration:  90 QT Interval:  392 QTC Calculation: 392 R Axis:   -25  Text Interpretation: EKG 03/29/2024: Normal sinus rhythm at the rate of 60 bpm, leftward axis, incomplete right bundle branch block.  Early R wave progression, cannot rule out posterior infarct old.  T wave abnormality, anteroseptal ischemia.  Compared to 02/01/2023 no significant change. Confirmed by Estefan Pattison, Jagadeesh ([id number]) on 03/29/2024 1:46:39 PM    Medications and allergies    No Known Allergies   Current Outpatient Medications:    aspirin  EC 81 MG tablet, Take 1 tablet (81 mg total) by mouth daily., Disp: 90 tablet, Rfl: 3   atenolol  (TENORMIN ) 25 MG tablet, TAKE 1 TABLET (25 MG TOTAL) BY MOUTH DAILY., Disp: 90 tablet, Rfl: 0   Coenzyme Q10 (CO Q10) 100 MG CAPS, Take 1 capsule by mouth daily., Disp: , Rfl:    ezetimibe  (ZETIA ) 10 MG tablet, TAKE  1 TABLET BY MOUTH DAILY AFTER SUPPER, Disp: 90 tablet, Rfl: 3   rosuvastatin  (CRESTOR ) 40 MG tablet, TAKE 1 TABLET BY MOUTH EVERY DAY, Disp: 90 tablet, Rfl: 0   No orders of the defined types were placed in  this encounter.    There are no discontinued medications.   ASSESSMENT AND PLAN: .      ICD-10-CM   1. Claudication in peripheral vascular disease (HCC)  I73.9 VAS US  LOWER EXTREMITY ARTERIAL DUPLEX    VAS US  ABI WITH/WO TBI    Lipid panel    2. Pure hypercholesterolemia  E78.00 VAS US  LOWER EXTREMITY ARTERIAL DUPLEX    VAS US  ABI WITH/WO TBI    Lipid panel    3. Primary hypertension  I10 EKG 12-Lead    VAS US  LOWER EXTREMITY ARTERIAL DUPLEX    VAS US  ABI WITH/WO TBI    Lipid panel    4. 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 The absence of pulse in the left leg is consistent with previous findings. He remains active, and symptoms do not currently warrant intervention.  Potential catheterization and stenting were discussed if symptoms worsen, involving catheterization from the groin to the legs to identify and treat blockages with options like stenting or balloon angioplasty. Order a lower extremity arterial duplex and ABI to assess current status.  Abdominal Aortic Aneurysm   The abdominal aortic aneurysm measures 5.6 cm. He follows up with Dr. Melton Squires at [facility] every six months. Plan to operate when the aneurysm reaches 6 cm. Post-surgery life expectancy may be influenced by other factors such as stroke or myocardial infarction, not necessarily the surgery itself.  Hypertension    Hypertension is well controlled with atenolol  25 mg once daily. Blood pressure is 104/74 mmHg. He is not on an ACE inhibitor or ARB due to hyperkalemia risk.  Hyperlipidemia   Hyperlipidemia is well controlled on Crestor  40 mg and Zetia  10 mg daily. There was a recent discrepancy in LDL levels between tests, possibly due to lab error. Plan to recheck cholesterol in one month to confirm control. Recheck cholesterol panel in one month at [facility].  Signed,  Knox Perl, MD, [facility] 03/30/2024, 5:45 PM [facility] [address] [place], [place] [address] Phone: [phone]. Fax:  [phone]

## 2024-04-06 ENCOUNTER — Encounter: Payer: Self-pay | Admitting: Vascular Surgery

## 2024-04-06 DIAGNOSIS — R339 Retention of urine, unspecified: Secondary | ICD-10-CM | POA: Diagnosis not present

## 2024-04-06 DIAGNOSIS — N319 Neuromuscular dysfunction of bladder, unspecified: Secondary | ICD-10-CM | POA: Diagnosis not present

## 2024-04-10 ENCOUNTER — Telehealth: Payer: Self-pay

## 2024-04-10 NOTE — Telephone Encounter (Signed)
 Advice: -pt's spouse sent message inquiring about upcoming duplex & ABI's and what MD should manage that. -advised spouse to keep imaging appts and f/u results with MD

## 2024-04-18 ENCOUNTER — Encounter: Payer: Self-pay | Admitting: Cardiology

## 2024-05-06 ENCOUNTER — Encounter: Payer: Self-pay | Admitting: Cardiology

## 2024-05-07 NOTE — Telephone Encounter (Signed)
 Please review and advise.

## 2024-05-08 ENCOUNTER — Encounter: Payer: Self-pay | Admitting: Cardiology

## 2024-05-08 DIAGNOSIS — I739 Peripheral vascular disease, unspecified: Secondary | ICD-10-CM | POA: Diagnosis not present

## 2024-05-08 DIAGNOSIS — I10 Essential (primary) hypertension: Secondary | ICD-10-CM | POA: Diagnosis not present

## 2024-05-08 DIAGNOSIS — E7800 Pure hypercholesterolemia, unspecified: Secondary | ICD-10-CM | POA: Diagnosis not present

## 2024-05-08 DIAGNOSIS — I7141 Pararenal abdominal aortic aneurysm, without rupture: Secondary | ICD-10-CM | POA: Diagnosis not present

## 2024-05-09 ENCOUNTER — Encounter: Payer: Self-pay | Admitting: Cardiology

## 2024-05-09 ENCOUNTER — Ambulatory Visit: Payer: Self-pay | Admitting: *Deleted

## 2024-05-09 LAB — LIPID PANEL
Chol/HDL Ratio: 2.3 ratio (ref 0.0–5.0)
Cholesterol, Total: 106 mg/dL (ref 100–199)
HDL: 47 mg/dL (ref >39)
LDL Chol Calc (NIH): 44 mg/dL (ref 0–99)
Triglycerides: 73 mg/dL (ref 0–149)
VLDL Cholesterol Cal: 15 mg/dL (ref 5–40)

## 2024-05-10 ENCOUNTER — Ambulatory Visit (HOSPITAL_COMMUNITY)
Admission: RE | Admit: 2024-05-10 | Discharge: 2024-05-10 | Disposition: A | Discharge status: Home / Self Care | Source: Ambulatory Visit | Attending: Cardiology | Admitting: Cardiology

## 2024-05-10 DIAGNOSIS — I10 Essential (primary) hypertension: Secondary | ICD-10-CM | POA: Diagnosis not present

## 2024-05-10 DIAGNOSIS — E7800 Pure hypercholesterolemia, unspecified: Secondary | ICD-10-CM

## 2024-05-10 DIAGNOSIS — I7141 Pararenal abdominal aortic aneurysm, without rupture: Secondary | ICD-10-CM

## 2024-05-10 DIAGNOSIS — I739 Peripheral vascular disease, unspecified: Secondary | ICD-10-CM | POA: Diagnosis not present

## 2024-05-12 LAB — VAS US ABI WITH/WO TBI
Left ABI: 0.86
Right ABI: 1.1

## 2024-05-12 NOTE — Progress Notes (Signed)
 Known left SFA disease, proceed with angiogram if his activity is limited, no danger if angiogram is not performed. He is on best medical therapy and the stenosis is not critical

## 2024-05-13 ENCOUNTER — Other Ambulatory Visit: Payer: Self-pay | Admitting: Cardiology

## 2024-05-13 DIAGNOSIS — R002 Palpitations: Secondary | ICD-10-CM

## 2024-05-26 ENCOUNTER — Other Ambulatory Visit: Payer: Self-pay | Admitting: Cardiology

## 2024-05-26 DIAGNOSIS — E7800 Pure hypercholesterolemia, unspecified: Secondary | ICD-10-CM

## 2024-05-28 DIAGNOSIS — N319 Neuromuscular dysfunction of bladder, unspecified: Secondary | ICD-10-CM | POA: Diagnosis not present

## 2024-05-28 DIAGNOSIS — R339 Retention of urine, unspecified: Secondary | ICD-10-CM | POA: Diagnosis not present

## 2024-06-25 DIAGNOSIS — R339 Retention of urine, unspecified: Secondary | ICD-10-CM | POA: Diagnosis not present

## 2024-06-25 DIAGNOSIS — N319 Neuromuscular dysfunction of bladder, unspecified: Secondary | ICD-10-CM | POA: Diagnosis not present

## 2024-07-19 DIAGNOSIS — I701 Atherosclerosis of renal artery: Secondary | ICD-10-CM | POA: Diagnosis not present

## 2024-07-19 DIAGNOSIS — I7142 Juxtarenal abdominal aortic aneurysm, without rupture: Secondary | ICD-10-CM | POA: Diagnosis not present

## 2024-07-19 DIAGNOSIS — I716 Thoracoabdominal aortic aneurysm, without rupture, unspecified: Secondary | ICD-10-CM | POA: Diagnosis not present

## 2024-07-20 DIAGNOSIS — N319 Neuromuscular dysfunction of bladder, unspecified: Secondary | ICD-10-CM | POA: Diagnosis not present

## 2024-07-20 DIAGNOSIS — R339 Retention of urine, unspecified: Secondary | ICD-10-CM | POA: Diagnosis not present

## 2024-08-31 ENCOUNTER — Encounter: Payer: Self-pay | Admitting: Cardiology

## 2024-08-31 ENCOUNTER — Telehealth: Payer: Self-pay | Admitting: Cardiology

## 2024-08-31 DIAGNOSIS — H34232 Retinal artery branch occlusion, left eye: Secondary | ICD-10-CM

## 2024-08-31 DIAGNOSIS — I7143 Infrarenal abdominal aortic aneurysm, without rupture: Secondary | ICD-10-CM

## 2024-08-31 NOTE — Telephone Encounter (Signed)
 Call transferred to DOD.

## 2024-08-31 NOTE — Telephone Encounter (Signed)
 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 Calling to speak with the DOD. Please advise.

## 2024-08-31 NOTE — Telephone Encounter (Signed)
 Dr. Wonda (DOD 9/26) spoke with [facility] about this pt. After discussion, Dr. Wonda suggest we have pt get an echo d/t his hx and get an appt with either Dr. Ladona or APP. Order for echo has been placed and this message will be forwarded to scheduling to get pt an appt.

## 2024-09-02 NOTE — Telephone Encounter (Signed)
 Please order carotid duplex. Indication central retinal artery occlusion left

## 2024-09-03 ENCOUNTER — Other Ambulatory Visit: Payer: Self-pay

## 2024-09-03 ENCOUNTER — Other Ambulatory Visit: Payer: Self-pay | Admitting: Cardiovascular Disease

## 2024-09-03 ENCOUNTER — Ambulatory Visit (HOSPITAL_BASED_OUTPATIENT_CLINIC_OR_DEPARTMENT_OTHER)
Admission: RE | Admit: 2024-09-03 | Discharge: 2024-09-03 | Disposition: A | Discharge status: Home / Self Care | Source: Ambulatory Visit | Attending: Cardiovascular Disease | Admitting: Cardiovascular Disease

## 2024-09-03 DIAGNOSIS — I517 Cardiomegaly: Secondary | ICD-10-CM

## 2024-09-03 DIAGNOSIS — H34232 Retinal artery branch occlusion, left eye: Secondary | ICD-10-CM

## 2024-09-03 DIAGNOSIS — I088 Other rheumatic multiple valve diseases: Secondary | ICD-10-CM | POA: Diagnosis not present

## 2024-09-03 DIAGNOSIS — I7143 Infrarenal abdominal aortic aneurysm, without rupture: Secondary | ICD-10-CM

## 2024-09-03 DIAGNOSIS — I5030 Unspecified diastolic (congestive) heart failure: Secondary | ICD-10-CM

## 2024-09-03 DIAGNOSIS — I779 Disorder of arteries and arterioles, unspecified: Secondary | ICD-10-CM

## 2024-09-03 LAB — ECHOCARDIOGRAM COMPLETE
AR max vel: 3.59 cm2
AV Area VTI: 3.85 cm2
AV Area mean vel: 3.48 cm2
AV Mean grad: 1 mmHg
AV Peak grad: 2.6 mmHg
Ao pk vel: 0.8 m/s
Area-P 1/2: 1.71 cm2
Calc EF: 61.4 %
MV M vel: 2.67 m/s
MV Peak grad: 28.5 mmHg
S' Lateral: 3.1 cm
Single Plane A2C EF: 61.8 %
Single Plane A4C EF: 58.6 %

## 2024-09-03 NOTE — Telephone Encounter (Signed)
 Attempted to contact patient to advise that carotid duplex order was placed. Left message to call back on personal voicemail.

## 2024-09-03 NOTE — Telephone Encounter (Signed)
 Order for carotid duplex has been placed but pt has not been called.

## 2024-09-05 NOTE — Telephone Encounter (Signed)
 Called his wife this morning, Ms. Ronal Minerva, discussed with her extensively regarding the carotid artery disease that was very mild on the left, mild to moderate on the right, repeat carotid artery duplex is probably appropriate in view of amaurosis fugax/left central retinal artery occlusion.  Also discussed lipid management, his LDL was 91 in March - April of 2025, goal LDL would be closer to 55 if possible at least <70.  He is presently on Crestor  40 mg daily.  He will obtain lipid profile with his PCP and send us  the report, will have low threshold to start him on PCSK9 inhibitors.  With regard to carotid artery screening, patient would like to have this done at [facility] where he is being closely followed for his AAA as well.  Hence we will cancel the carotid artery duplex here.  I reviewed the results of the echocardiogram revealing normal LVEF and no significant valvular heart disease and mild diastolic dysfunction.  She was appreciative of my call.

## 2024-09-06 DIAGNOSIS — I6523 Occlusion and stenosis of bilateral carotid arteries: Secondary | ICD-10-CM | POA: Diagnosis not present

## 2024-09-06 DIAGNOSIS — I779 Disorder of arteries and arterioles, unspecified: Secondary | ICD-10-CM | POA: Diagnosis not present

## 2024-09-07 ENCOUNTER — Ambulatory Visit (HOSPITAL_COMMUNITY)

## 2024-09-07 ENCOUNTER — Encounter (HOSPITAL_COMMUNITY): Payer: Self-pay

## 2024-09-08 ENCOUNTER — Ambulatory Visit: Payer: Self-pay | Admitting: Cardiovascular Disease

## 2024-09-09 ENCOUNTER — Encounter: Payer: Self-pay | Admitting: Cardiology

## 2024-09-13 ENCOUNTER — Encounter: Payer: Self-pay | Admitting: Cardiology

## 2024-09-13 DIAGNOSIS — I6502 Occlusion and stenosis of left vertebral artery: Secondary | ICD-10-CM | POA: Diagnosis not present

## 2024-09-13 DIAGNOSIS — I672 Cerebral atherosclerosis: Secondary | ICD-10-CM | POA: Diagnosis not present

## 2024-09-13 DIAGNOSIS — I7142 Juxtarenal abdominal aortic aneurysm, without rupture: Secondary | ICD-10-CM | POA: Diagnosis not present

## 2024-09-13 DIAGNOSIS — I779 Disorder of arteries and arterioles, unspecified: Secondary | ICD-10-CM | POA: Diagnosis not present

## 2024-09-13 DIAGNOSIS — R339 Retention of urine, unspecified: Secondary | ICD-10-CM | POA: Diagnosis not present

## 2024-09-13 DIAGNOSIS — I6523 Occlusion and stenosis of bilateral carotid arteries: Secondary | ICD-10-CM | POA: Diagnosis not present

## 2024-09-13 DIAGNOSIS — J432 Centrilobular emphysema: Secondary | ICD-10-CM | POA: Diagnosis not present

## 2024-09-13 DIAGNOSIS — I708 Atherosclerosis of other arteries: Secondary | ICD-10-CM | POA: Diagnosis not present

## 2024-09-13 DIAGNOSIS — N319 Neuromuscular dysfunction of bladder, unspecified: Secondary | ICD-10-CM | POA: Diagnosis not present

## 2024-09-21 ENCOUNTER — Telehealth: Payer: Self-pay | Admitting: Cardiology

## 2024-09-21 NOTE — Telephone Encounter (Signed)
 Paper Work Dropped Off: Letter from Vascular Dr (patient dropped off)  Date:09/21/24  Location of paper:  Dr Ladona box

## 2024-09-24 ENCOUNTER — Other Ambulatory Visit (HOSPITAL_COMMUNITY)

## 2024-09-25 ENCOUNTER — Telehealth: Payer: Self-pay | Admitting: Cardiology

## 2024-09-25 DIAGNOSIS — H5340 Unspecified visual field defects: Secondary | ICD-10-CM | POA: Diagnosis not present

## 2024-09-25 DIAGNOSIS — H34232 Retinal artery branch occlusion, left eye: Secondary | ICD-10-CM | POA: Diagnosis not present

## 2024-09-25 NOTE — Telephone Encounter (Signed)
-----   Message from Nurse Corean PARAS sent at 09/25/2024  8:32 AM EDT ----- Regarding: Appt Please set appt with Dr Ladona per paperwork sent in. Thank you so much!

## 2024-09-25 NOTE — Telephone Encounter (Signed)
 Patient scheduled to come in on 10/29/24 and on the waitlist

## 2024-09-28 ENCOUNTER — Encounter: Payer: Self-pay | Admitting: Cardiology

## 2024-09-28 DIAGNOSIS — N1831 Chronic kidney disease, stage 3a: Secondary | ICD-10-CM | POA: Diagnosis not present

## 2024-09-28 DIAGNOSIS — E1122 Type 2 diabetes mellitus with diabetic chronic kidney disease: Secondary | ICD-10-CM | POA: Diagnosis not present

## 2024-09-28 DIAGNOSIS — H349 Unspecified retinal vascular occlusion: Secondary | ICD-10-CM | POA: Diagnosis not present

## 2024-09-28 LAB — LAB REPORT - SCANNED
A1c: 6.4
Albumin, Urine POC: <0.7
Creatinine, POC: 22 mg/dL
EGFR: 62

## 2024-10-03 ENCOUNTER — Encounter: Payer: Self-pay | Admitting: Neurology

## 2024-10-03 ENCOUNTER — Ambulatory Visit: Admitting: Neurology

## 2024-10-03 VITALS — BP 116/81 | HR 60 | Ht 69.0 in | Wt 147.2 lb

## 2024-10-03 DIAGNOSIS — I6523 Occlusion and stenosis of bilateral carotid arteries: Secondary | ICD-10-CM | POA: Diagnosis not present

## 2024-10-03 DIAGNOSIS — Z860100 Personal history of colon polyps, unspecified: Secondary | ICD-10-CM | POA: Insufficient documentation

## 2024-10-03 DIAGNOSIS — H349 Unspecified retinal vascular occlusion: Secondary | ICD-10-CM | POA: Insufficient documentation

## 2024-10-03 DIAGNOSIS — I771 Stricture of artery: Secondary | ICD-10-CM

## 2024-10-03 DIAGNOSIS — E1122 Type 2 diabetes mellitus with diabetic chronic kidney disease: Secondary | ICD-10-CM | POA: Insufficient documentation

## 2024-10-03 DIAGNOSIS — H34232 Retinal artery branch occlusion, left eye: Secondary | ICD-10-CM

## 2024-10-03 DIAGNOSIS — I714 Abdominal aortic aneurysm, without rupture, unspecified: Secondary | ICD-10-CM | POA: Insufficient documentation

## 2024-10-03 DIAGNOSIS — R7303 Prediabetes: Secondary | ICD-10-CM | POA: Insufficient documentation

## 2024-10-03 DIAGNOSIS — I739 Peripheral vascular disease, unspecified: Secondary | ICD-10-CM | POA: Insufficient documentation

## 2024-10-03 DIAGNOSIS — N1831 Chronic kidney disease, stage 3a: Secondary | ICD-10-CM | POA: Insufficient documentation

## 2024-10-03 MED ORDER — CLOPIDOGREL BISULFATE 75 MG PO TABS: 75.0000 mg | ORAL_TABLET | Freq: Every day | ORAL | 11 refills | Status: DC

## 2024-10-03 NOTE — Progress Notes (Signed)
 [facility] [address] [place]. [place] [address] [phone]       OFFICE CONSULT NOTE  Mr. Ian [name] Date of Birth:  1951-08-20 Medical Record Number:  [id number]   Referring MD: Delon Stalling, NP  Reason for Referral: Retinal artery branch occlusion and stroke risk  HPI: Ian [name] is a pleasant 73 year old Caucasian male seen today for initial office consultation visit.  He is accompanied by his wife.  History is obtained from them and review of electronic medical records as well as referral notes that they have brought with them.  Personally reviewed pertinent available imaging PACS.  He has past medical history of hyperlipidemia, chronic kidney disease and abdominal aortic aneurysm.  Patient states he developed on 07/28/2024 sudden onset of painless partial vision loss in the lower portion of the left eye.  This has persisted since then.  He denies any accompanying headache or pain.  He was seen by ophthalmologist who diagnosed retinal artery branch occlusion.  Patient denies any prior history of strokes TIA seizures or significant neurological problems.  He was seen by his vascular surgeon at [facility] who ordered CT angiogram of the brain on 09/13/2024 which shows calcified atherosclerotic plaque resulting in severe stenosis of the right V4 segment and moderate stenosis of the left V4 segment and left clinoid internal carotid artery.  CT angiogram of the neck also shows extensive calcified atherosclerotic disease of bilateral carotid left vertebral and subclavian arteries with severe stenosis of the origin of left common, left vertebral as well as left subclavian artery which also shows a focal area of chronic segment dissection.  These actual images are not available for my personal review today.  A carotid ultrasound done at [facility] vascular surgery lab on 09/06/2022 shows abnormal flow signal in the left subclavian artery however right ICA stenosis only 50 to 69% and  left is less than 50%.  Patient however denies any symptoms suggestive of vertebrobasilar ischemia, subclavian steal or hemispheric TIA or stroke symptoms.  Patient was on aspirin  which was continued.  He is also on Crestor  and Zetia  and last lipid profile on 09/28/2024 showed LDL cholesterol to be optimal at 42 mg percent.  Hemoglobin A1c was 6.4.  Echocardiogram on 09/03/2024 shows EF of 55 to 60% with mild left ventricular hypertrophy.  Left atrial size was normal.  States his blood pressure is under good control and today it is 116/81.  He has no symptoms today.  ROS:   14 system review of systems is positive for left eye partial vision loss and bruising only all other systems negative  PMH:  Past Medical History:  Diagnosis Date   AAA (abdominal aortic aneurysm)    Chronic kidney disease    Hypercholesteremia     Social History:  Social History   Socioeconomic History   Marital status: Married    Spouse name: Not on file   Number of children: 0   Years of education: 16   Highest education level: Bachelor's degree (e.g., BA, AB, BS)  Occupational History   Occupation: Quality control  Tobacco Use   Smoking status: Former    Current packs/day: 0.00    Average packs/day: 0.5 packs/day for 40.0 years (20.0 ttl pk-yrs)    Types: Cigarettes    Start date: 66    Quit date: 2015    Years since quitting: 10.8   Smokeless tobacco: Never  Vaping Use   Vaping status: Never Used  Substance and Sexual Activity  Alcohol use: Yes    Comment: occ   Drug use: No   Sexual activity: Not on file  Other Topics Concern   Not on file  Social History Narrative   Lives at home with his wife.   Right-handed.   No daily use of caffeine.   Social Drivers of Corporate Investment Banker Strain: Not on file  Food Insecurity: No Food Insecurity (09/06/2024)   Received from [facility]   Hunger Vital Sign    Within the past 12 months, you worried that your food would run out before you  got the money to buy more.: Never true    Within the past 12 months, the food you bought just didn't last and you didn't have money to get more.: Never true  Transportation Needs: No Transportation Needs (09/06/2024)   Received from [facility] - Transportation    Lack of Transportation (Medical): No    Lack of Transportation (Non-Medical): No  Physical Activity: Not on file  Stress: Not on file  Social Connections: Not on file  Intimate Partner Violence: Not on file    Medications:   Current Outpatient Medications on File Prior to Visit  Medication Sig Dispense Refill   ascorbic acid (VITAMIN C) 1000 MG tablet Take 1,000 mg by mouth 2 (two) times daily.     aspirin  EC 81 MG tablet Take 1 tablet (81 mg total) by mouth daily. 90 tablet 3   atenolol  (TENORMIN ) 25 MG tablet TAKE 1 TABLET (25 MG TOTAL) BY MOUTH DAILY. 90 tablet 3   Cholecalciferol 50 MCG (2000 UT) TABS      Coenzyme Q10 (CO Q10) 100 MG CAPS Take 1 capsule by mouth daily.     ezetimibe  (ZETIA ) 10 MG tablet TAKE 1 TABLET BY MOUTH DAILY AFTER SUPPER 90 tablet 3   Omega-3 Fatty Acids (OMEGA 3 FISH OIL PO) Take by mouth.     rosuvastatin  (CRESTOR ) 40 MG tablet TAKE 1 TABLET BY MOUTH EVERY DAY 90 tablet 3   vitamin k 100 MCG tablet 1 tablet Orally Once a day     No current facility-administered medications on file prior to visit.    Allergies:  No Known Allergies  Physical Exam General: well developed, well nourished pleasant elderly Caucasian male, seated, in no evident distress Head: head normocephalic and atraumatic.   Neck: supple with soft bilateral carotid and heart left supraclavicular bruit  cardiovascular: regular rate and rhythm, soft ejection systolic murmur. Musculoskeletal: no deformity Skin:  no rash/petichiae Vascular:  Normal pulses all extremities.  Both radial pulses are equally strong.  Adson test is negative.  Neurologic Exam Mental Status: Awake and fully alert. Oriented to place and  time. Recent and remote memory intact. Attention span, concentration and fund of knowledge appropriate. Mood and affect appropriate.  Cranial Nerves: Fundoscopic exam reveals sharp disc margins. Pupils equal, briskly reactive to light. Extraocular movements full without nystagmus. Visual fields full to confrontation except diminished left eye inferior nasal quadrant visual acuity.SABRA Hearing intact. Facial sensation intact. Face, tongue, palate moves normally and symmetrically.  Motor: Normal bulk and tone. Normal strength in all tested extremity muscles. Sensory.: intact to touch , pinprick , position and vibratory sensation.  Coordination: Rapid alternating movements normal in all extremities. Finger-to-nose and heel-to-shin performed accurately bilaterally. Gait and Station: Arises from chair without difficulty. Stance is normal. Gait demonstrates normal stride length and balance . Able to heel, toe and tandem walk with mild difficulty.  Reflexes: 1+ and symmetric. Toes downgoing.   NIHSS  0 Modified Rankin  2   ASSESSMENT: 73 year old Caucasian male with left eye partial painless vision loss due to retinal artery branch occlusion with significant extracranial occlusive vascular disease but no focal neurovascular or subclavian steal symptoms.  Multiple vascular risk factors of diabetes, hypertension, hyperlipidemia multivessel extracranial occlusive disease.     PLAN:I had a long d/w patient and his wife about his recent left eye retinal branch occlusion, discussed results of carotid ultrasound and CT angiogram of the brain and neck, risk for recurrent stroke/TIAs, personally independently reviewed imaging studies and stroke evaluation results and answered questions.discontinue aspirin  and changed to Plavix 75 mg daily for  stroke prevention and maintain strict control of hypertension with blood pressure goal below 130/90, diabetes with hemoglobin A1c goal below 6.5% and lipids with LDL cholesterol  goal below 70 mg/dL. I also advised the patient to eat a healthy diet with plenty of whole grains, cereals, fruits and vegetables, exercise regularly and maintain ideal body weight.  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  Check screening follow-up carotid ultrasound in 6 months or earlier if he has symptoms.  Followup in the future with me only as necessary.   I personally spent a total of 50 minutes in the care of the patient today including getting/reviewing separately obtained history, performing a medically appropriate exam/evaluation, counseling and educating, placing orders, referring and communicating with other health care professionals, documenting clinical information in the EHR, independently interpreting results, and coordinating care.        Eather Popp, MD Note: This document was prepared with digital dictation and possible smart phrase technology. Any transcriptional errors that result from this process are unintentional.

## 2024-10-03 NOTE — Patient Instructions (Signed)
 I had a long d/w patient and his wife about his recent left eye retinal branch occlusion, discussed results of carotid ultrasound and CT angiogram of the brain and neck, risk for recurrent stroke/TIAs, personally independently reviewed imaging studies and stroke evaluation results and answered questions.discontinue aspirin  and changed to Plavix 75 mg daily for  stroke prevention and maintain strict control of hypertension with blood pressure goal below 130/90, diabetes with hemoglobin A1c goal below 6.5% and lipids with LDL cholesterol goal below 70 mg/dL. I also advised the patient to eat a healthy diet with plenty of whole grains, cereals, fruits and vegetables, exercise regularly and maintain ideal body weight.  Check screening follow-up carotid ultrasound in 6 months or earlier if he has symptoms.  Followup in the future with me only as necessary.  Stroke Prevention Some medical conditions and behaviors can lead to a higher chance of having a stroke. You can help prevent a stroke by eating healthy, exercising, not smoking, and managing any medical conditions you have. Stroke is a leading cause of functional impairment. Primary prevention is particularly important because a majority of strokes are first-time events. Stroke changes the lives of not only those who experience a stroke but also their family and other caregivers. How can this condition affect me? A stroke is a medical emergency and should be treated right away. A stroke can lead to brain damage and can sometimes be life-threatening. If a person gets medical treatment right away, there is a better chance of surviving and recovering from a stroke. What can increase my risk? The following medical conditions may increase your risk of a stroke: Cardiovascular disease. High blood pressure (hypertension). Diabetes. High cholesterol. Sickle cell disease. Blood clotting disorders (hypercoagulable state). Obesity. Sleep disorders (obstructive  sleep apnea). Other risk factors include: Being older than age 64. Having a history of blood clots, stroke, or mini-stroke (transient ischemic attack, TIA). Genetic factors, such as race, ethnicity, or a family history of stroke. Smoking cigarettes or using other tobacco products. Taking birth control pills, especially if you also use tobacco. Heavy use of alcohol or drugs, especially cocaine and methamphetamine. Physical inactivity. What actions can I take to prevent this? Manage your health conditions High cholesterol levels. Eating a healthy diet is important for preventing high cholesterol. If cholesterol cannot be managed through diet alone, you may need to take medicines. Take any prescribed medicines to control your cholesterol as told by your health care provider. Hypertension. To reduce your risk of stroke, try to keep your blood pressure below 130/80. Eating a healthy diet and exercising regularly are important for controlling blood pressure. If these steps are not enough to manage your blood pressure, you may need to take medicines. Take any prescribed medicines to control hypertension as told by your health care provider. Ask your health care provider if you should monitor your blood pressure at home. Have your blood pressure checked every year, even if your blood pressure is normal. Blood pressure increases with age and some medical conditions. Diabetes. Eating a healthy diet and exercising regularly are important parts of managing your blood sugar (glucose). If your blood sugar cannot be managed through diet and exercise, you may need to take medicines. Take any prescribed medicines to control your diabetes as told by your health care provider. Get evaluated for obstructive sleep apnea. Talk to your health care provider about getting a sleep evaluation if you snore a lot or have excessive sleepiness. Make sure that any other medical conditions you  have, such as atrial  fibrillation or atherosclerosis, are managed. Nutrition Follow instructions from your health care provider about what to eat or drink to help manage your health condition. These instructions may include: Reducing your daily calorie intake. Limiting how much salt (sodium) you use to 1,500 milligrams (mg) each day. Using only healthy fats for cooking, such as olive oil, canola oil, or sunflower oil. Eating healthy foods. You can do this by: Choosing foods that are high in fiber, such as whole grains, and fresh fruits and vegetables. Eating at least 5 servings of fruits and vegetables a day. Try to fill one-half of your plate with fruits and vegetables at each meal. Choosing lean protein foods, such as lean cuts of meat, poultry without skin, fish, tofu, beans, and nuts. Eating low-fat dairy products. Avoiding foods that are high in sodium. This can help lower blood pressure. Avoiding foods that have saturated fat, trans fat, and cholesterol. This can help prevent high cholesterol. Avoiding processed and prepared foods. Counting your daily carbohydrate intake.  Lifestyle If you drink alcohol: Limit how much you have to: 0-1 drink a day for women who are not pregnant. 0-2 drinks a day for men. Know how much alcohol is in your drink. In the [place] one drink equals one 12 oz bottle of beer ( ), one 5 oz glass of wine ( ), or one 1 oz glass of hard liquor (44mL). Do not use any products that contain nicotine or tobacco. These products include cigarettes, chewing tobacco, and vaping devices, such as e-cigarettes. If you need help quitting, ask your health care provider. Avoid secondhand smoke. Do not use drugs. Activity  Try to stay at a healthy weight. Get at least 30 minutes of exercise on most days, such as: Fast walking. Biking. Swimming. Medicines Take over-the-counter and prescription medicines only as told by your health care provider. Aspirin  or blood thinners (antiplatelets  or anticoagulants) may be recommended to reduce your risk of forming blood clots that can lead to stroke. Avoid taking birth control pills. Talk to your health care provider about the risks of taking birth control pills if: You are over 52 years old. You smoke. You get very bad headaches. You have had a blood clot. Where to find more information American Stroke Association: www.strokeassociation.org Get help right away if: You or a loved one has any symptoms of a stroke. BE FAST is an easy way to remember the main warning signs of a stroke: B - Balance. Signs are dizziness, sudden trouble walking, or loss of balance. E - Eyes. Signs are trouble seeing or a sudden change in vision. F - Face. Signs are sudden weakness or numbness of the face, or the face or eyelid drooping on one side. A - Arms. Signs are weakness or numbness in an arm. This happens suddenly and usually on one side of the body. S - Speech. Signs are sudden trouble speaking, slurred speech, or trouble understanding what people say. T - Time. Time to call emergency services. Write down what time symptoms started. You or a loved one has other signs of a stroke, such as: A sudden, severe headache with no known cause. Nausea or vomiting. Seizure. These symptoms may represent a serious problem that is an emergency. Do not wait to see if the symptoms will go away. Get medical help right away. Call your local emergency services (911 in the [place] Do not drive yourself to the hospital. Summary You can help to prevent a stroke by eating  healthy, exercising, not smoking, limiting alcohol intake, and managing any medical conditions you may have. Do not use any products that contain nicotine or tobacco. These include cigarettes, chewing tobacco, and vaping devices, such as e-cigarettes. If you need help quitting, ask your health care provider. Remember BE FAST for warning signs of a stroke. Get help right away if you or a loved one has  any of these signs. This information is not intended to replace advice given to you by your health care provider. Make sure you discuss any questions you have with your health care provider. Document Revised: 10/25/2022 Document Reviewed: 10/25/2022 Elsevier Patient Education  2024 [employer].

## 2024-10-05 ENCOUNTER — Encounter: Payer: Self-pay | Admitting: Cardiology

## 2024-10-08 DIAGNOSIS — R339 Retention of urine, unspecified: Secondary | ICD-10-CM | POA: Diagnosis not present

## 2024-10-08 DIAGNOSIS — N319 Neuromuscular dysfunction of bladder, unspecified: Secondary | ICD-10-CM | POA: Diagnosis not present

## 2024-10-17 ENCOUNTER — Encounter: Payer: Self-pay | Admitting: Cardiology

## 2024-10-29 ENCOUNTER — Ambulatory Visit: Admitting: Neurology

## 2024-10-29 ENCOUNTER — Ambulatory Visit: Attending: Cardiology | Admitting: Cardiology

## 2024-10-29 ENCOUNTER — Encounter: Payer: Self-pay | Admitting: Cardiology

## 2024-10-29 VITALS — BP 102/63 | HR 63 | Resp 16 | Ht 69.0 in | Wt 145.8 lb

## 2024-10-29 DIAGNOSIS — I10 Essential (primary) hypertension: Secondary | ICD-10-CM

## 2024-10-29 DIAGNOSIS — I6523 Occlusion and stenosis of bilateral carotid arteries: Secondary | ICD-10-CM

## 2024-10-29 DIAGNOSIS — I739 Peripheral vascular disease, unspecified: Secondary | ICD-10-CM | POA: Diagnosis not present

## 2024-10-29 DIAGNOSIS — H3412 Central retinal artery occlusion, left eye: Secondary | ICD-10-CM

## 2024-10-29 DIAGNOSIS — E782 Mixed hyperlipidemia: Secondary | ICD-10-CM

## 2024-10-29 DIAGNOSIS — I7143 Infrarenal abdominal aortic aneurysm, without rupture: Secondary | ICD-10-CM

## 2024-10-29 MED ORDER — CLOPIDOGREL BISULFATE 75 MG PO TABS: 75.0000 mg | ORAL_TABLET | Freq: Every day | ORAL | 3 refills | Status: AC

## 2024-10-29 NOTE — Patient Instructions (Signed)
 Medication Instructions:  Your physician recommends that you continue on your current medications as directed. Please refer to the Current Medication list given to you today.  *If you need a refill on your cardiac medications before your next appointment, please call your pharmacy*  Lab Work: NONE ordered at this time of appointment   Testing/Procedures: NONE ordered at this time of appointment   Follow-Up: At [facility], you and your health needs are our priority.  As part of our continuing mission to provide you with exceptional heart care, our providers are all part of one team.  This team includes your primary Cardiologist (physician) and Advanced Practice Providers or APPs (Physician Assistants and Nurse Practitioners) who all work together to provide you with the care you need, when you need it.  Your next appointment:   6 month(s)  Provider:   Knox Perl, MD    We recommend signing up for the patient portal called MyChart.  Sign up information is provided on this After Visit Summary.  MyChart is used to connect with patients for Virtual Visits (Telemedicine).  Patients are able to view lab/test results, encounter notes, upcoming appointments, etc.  Non-urgent messages can be sent to your provider as well.   To learn more about what you can do with MyChart, go to [email or web address].

## 2024-10-29 NOTE — Progress Notes (Addendum)
 Cardiology Office Note:  .   Date:  10/29/2024  ID:  Ian [name], DOB 10-16-1951, MRN [id number] PCP: Dayna Motto, DO  [facility] Providers Cardiologist:  Gordy Bergamo, MD   History of Present Illness: .   Ian [name] is a 73 y.o. 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  In view of left eye amaurosis fugax, he underwent CTA head and carotid duplex at [facility], he follows [facility] for abdominal attic aneurysm and also carotid artery duplex was performed at vascular surgery at [facility], [place].  Extremely anxious wife with multiple messages on MyChart, please see documentation.  She has also been evaluated by neurology.,  Dr. Rosemarie.  Patient presently on Plavix  after extensive evaluation and recommendations.  He is presently doing well and has not had any TIA-like symptoms.  Essentially remains asymptomatic since with minimal symptoms of claudication.  States that he is very active.    Discussed the use of AI scribe software for clinical note transcription with the patient, who gave verbal consent to proceed.  History of Present Illness Ian [name] is a 73 year old male with cardiovascular disease who presents for follow-up of his cardiovascular health and medication management.  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 His neurologist switched his medication from aspirin  to Plavix  (clopidogrel ) to prevent further vascular events. He also takes omega-3 supplements.  He has plaque buildup in his carotid arteries,  monitored without surgical intervention. Cholesterol levels are well-controlled with Crestor  40 mg and Zetia  10 mg daily, with recent labs showing an LDL of 42 and HDL of 50. Blood pressure is well-managed with atenolol  25 mg daily, currently at 102/63.  Leg cramps have improved since starting Plavix  and omega-3 supplements. He remains active and is monitored for peripheral arterial disease, with a known blockage in the left superficial femoral artery managed conservatively.  Cardiac Studies relevent.    CTA neck 09/13/2024: Extensive calcified and noncalcified atherosclerotic disease of bilateral carotid, left vertebral and subclavian arteries as described with :  - Moderate stenosis of the right carotid bifurcation and origin of the right ICA.  - Severe stenosis at the origin of the left common carotid artery.  - Severe stenosis at the origin of the left vertebral artery.  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   Carotid artery duplex 09/06/2024:    1. Left subclavian artery flow appears disturbed.   2. Right ICA stenosis 50 - 69%.   3. The Left ICA stenosis less than 50%.   Lower Extremity Arterial Duplex 06/11/2024: Left lower extremity: Focal elevation of velocity at 392 cm/s in the mid to distal SFA with posttraumatic turbulence with a VR of 2.5 suggestive of 50 to 74% stenosis. Right ABI 1.10 and left ABI 0.86.  Labs   Lab Results  Component Value Date   CHOL 106 05/08/2024   HDL 47 05/08/2024   LDLCALC 44 05/08/2024   TRIG 73 05/08/2024   CHOLHDL 2.3 05/08/2024   Lipoprotein (a)  Date/Time Value Ref Range Status  03/03/2023 01:47 PM 30.6 <75.0 nmol/L Final  Comment:    Note:  Values greater than or equal to 75.0 nmol/L may        indicate an independent risk factor for CHD,        but must be evaluated with caution when applied        to non-Caucasian populations due to the        influence of genetic  factors on Lp(a) across        ethnicities.         Latest Ref Rng & Units 03/22/2024   10:12 AM 07/05/2019    1:42 PM  CBC  WBC 3.4 - 10.8 x10E3/uL 5.6  7.4   Hemoglobin 13.0 - 17.7 g/dL 84.7  87.2   Hematocrit 37.5 - 51.0 % 49.4  39.5   Platelets 150 - 450 x10E3/uL 334  262    Care everywhere/Faxed External Labs:  Labs 09/08/2024:  A1c 6.4%.  Serum glucose 107 mg, BUN 30, creatinine 1.23, eGFR 62 mL, potassium 4.2, LFTs normal.  Total cholesterol 107, triglycerides 63, HDL 50, LDL 42.  ROS  Review of Systems  Cardiovascular:  Positive for claudication (mild). Negative for chest pain, dyspnea on exertion and leg swelling.   Physical Exam:   VS:  BP 102/63 (BP Location: Left Arm, Patient Position: Sitting, Cuff Size: Normal)   Pulse 63   Resp 16   Ht 5' 9 (1.753 m)   Wt 145 lb 12.8 oz (66.1 kg)   SpO2 99%   BMI 21.53 kg/m    Wt Readings from Last 3 Encounters:  10/29/24 145 lb 12.8 oz (66.1 kg)  10/03/24 147 lb 3.2 oz (66.8 kg)  03/29/24 143 lb (64.9 kg)    BP Readings from Last 3 Encounters:  10/29/24 102/63  10/03/24 116/81  03/29/24 104/74   Physical Exam Neck:     Vascular: No carotid bruit or JVD.  Cardiovascular:     Rate and Rhythm: Normal rate and regular rhythm.     Pulses: Intact distal pulses.          Carotid pulses are  on the right side with bruit.      Dorsalis pedis pulses are 0 on the right side and 0 on the left side.       Posterior tibial pulses are 0 on the right side and 0 on the left side.     Heart sounds: Normal heart sounds. No murmur heard.    No gallop.  Pulmonary:     Effort: Pulmonary effort is normal.     Breath sounds: Normal breath sounds.  Abdominal:     General: Bowel sounds are normal.     Palpations: Abdomen is soft.  Musculoskeletal:     Right lower leg: No edema.     Left lower leg: No edema.    EKG:         ASSESSMENT AND PLAN: .      ICD-10-CM   1. Claudication in peripheral vascular disease  I73.9 EKG  12-Lead    2. Bilateral carotid artery stenosis  I65.23 clopidogrel  (PLAVIX ) 75 MG tablet    3. Infrarenal abdominal aortic aneurysm (AAA) without rupture  I71.43     4. Essential hypertension  I10     5. Mixed hyperlipidemia  E78.2     6. Central retinal artery occlusion of left eye  H34.12 clopidogrel  (PLAVIX ) 75 MG tablet     Assessment & Plan Central retinal artery occlusion, left eye Central retinal artery occlusion in  the left eye occurred approximately two months ago, resulting in approximately 70-75% vision retention, with some difficulty in the lower visual field. The condition is well-managed, and he reports no significant impact on daily activities unless one eye is covered. - Continue clopidogrel  (Plavix ) for long-term management.  Occlusion and stenosis of bilateral carotid arteries Bilateral carotid artery stenosis at the origin of left CCA and vertebral artery with plaque buildup. Intervention is not recommended due to increased stroke risk. Current management with clopidogrel  is appropriate. - Continue clopidogrel  (Plavix ) for stroke prevention. - Lipid-lowering with goal LDL <55.  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 Symptoms are minimal, and current medical therapy is deemed sufficient. - Continue current medical therapy for peripheral arterial disease.  Infrarenal abdominal aortic aneurysm, without rupture Infrarenal abdominal aortic aneurysm remains unchanged for about two years. No intervention is required at this time. - Continue monitoring with abdominal duplex ultrasound. - Follows [facility], [place], [place] Vascular surgery.  Essential hypertension Blood pressure is well-controlled at 102/63 mmHg with current medication regimen. - Continue atenolol  25 mg once daily.  Mixed hyperlipidemia Cholesterol levels are well-controlled with LDL at 42  mg/dL and HDL at 50 mg/dL. Current lipid-lowering therapy is effective. - Continue Crestor  40 mg once daily and Zetia  10 mg once daily.   Follow up: 6 months and if he remains stable on annual basis.  Extensive review of his medical records from [facility] and also neurology consult evaluated. Signed,  Gordy Bergamo, MD, [facility] 10/29/2024, 2:50 PM [facility] [address] [place], [place] [address] Phone: [phone]. Fax:  [phone]

## 2024-11-02 ENCOUNTER — Encounter: Payer: Self-pay | Admitting: Cardiology

## 2024-11-09 DIAGNOSIS — N319 Neuromuscular dysfunction of bladder, unspecified: Secondary | ICD-10-CM | POA: Diagnosis not present

## 2024-11-09 DIAGNOSIS — R339 Retention of urine, unspecified: Secondary | ICD-10-CM | POA: Diagnosis not present

## 2024-12-26 ENCOUNTER — Encounter: Payer: Self-pay | Admitting: Cardiology

## 2024-12-26 DIAGNOSIS — I6523 Occlusion and stenosis of bilateral carotid arteries: Secondary | ICD-10-CM

## 2024-12-28 NOTE — Telephone Encounter (Signed)
 Ian [name] placed a new order to be done in 1st week of April

## 2024-12-28 NOTE — Telephone Encounter (Signed)
"    ICD-10-CM   1. Bilateral carotid artery stenosis  I65.23 VAS US  CAROTID     "
